# Patient Record
Sex: Female | Born: 1985 | Race: White | Hispanic: No | Marital: Single | Smoking: Current every day smoker
Health system: Southern US, Community
[De-identification: ages and names within clinical notes are randomized; demographics above are authoritative.]

## PROBLEM LIST (undated history)

## (undated) DIAGNOSIS — N12 Tubulo-interstitial nephritis, not specified as acute or chronic: Secondary | ICD-10-CM

## (undated) DIAGNOSIS — D649 Anemia, unspecified: Secondary | ICD-10-CM

## (undated) DIAGNOSIS — N132 Hydronephrosis with renal and ureteral calculous obstruction: Secondary | ICD-10-CM

## (undated) HISTORY — PX: TUBAL LIGATION: SHX77

## (undated) HISTORY — PX: WRIST SURGERY: SHX841

---
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
## 2004-01-24 ENCOUNTER — Emergency Department (HOSPITAL_COMMUNITY): Admission: EM | Admit: 2004-01-24 | Discharge: 2004-01-24 | Payer: Self-pay | Admitting: Emergency Medicine

## 2004-01-25 ENCOUNTER — Emergency Department (HOSPITAL_COMMUNITY): Admission: EM | Admit: 2004-01-25 | Discharge: 2004-01-26 | Payer: Self-pay | Admitting: Emergency Medicine

## 2004-07-05 ENCOUNTER — Inpatient Hospital Stay (HOSPITAL_COMMUNITY): Admission: AD | Admit: 2004-07-05 | Discharge: 2004-07-06 | Payer: Self-pay | Admitting: Obstetrics & Gynecology

## 2004-07-08 ENCOUNTER — Other Ambulatory Visit: Admission: RE | Admit: 2004-07-08 | Discharge: 2004-07-08 | Payer: Self-pay | Admitting: Obstetrics & Gynecology

## 2004-07-15 ENCOUNTER — Inpatient Hospital Stay (HOSPITAL_COMMUNITY): Admission: AD | Admit: 2004-07-15 | Discharge: 2004-07-15 | Payer: Self-pay | Admitting: Obstetrics and Gynecology

## 2004-09-02 ENCOUNTER — Inpatient Hospital Stay (HOSPITAL_COMMUNITY): Admission: AD | Admit: 2004-09-02 | Discharge: 2004-09-02 | Payer: Self-pay | Admitting: Obstetrics and Gynecology

## 2004-09-10 ENCOUNTER — Inpatient Hospital Stay (HOSPITAL_COMMUNITY): Admission: AD | Admit: 2004-09-10 | Discharge: 2004-09-10 | Payer: Self-pay | Admitting: Obstetrics and Gynecology

## 2005-01-01 ENCOUNTER — Inpatient Hospital Stay (HOSPITAL_COMMUNITY): Admission: AD | Admit: 2005-01-01 | Discharge: 2005-01-02 | Payer: Self-pay | Admitting: Obstetrics & Gynecology

## 2005-01-11 ENCOUNTER — Emergency Department (HOSPITAL_COMMUNITY): Admission: EM | Admit: 2005-01-11 | Discharge: 2005-01-11 | Payer: Self-pay | Admitting: Emergency Medicine

## 2005-02-06 ENCOUNTER — Inpatient Hospital Stay (HOSPITAL_COMMUNITY): Admission: AD | Admit: 2005-02-06 | Discharge: 2005-02-06 | Payer: Self-pay | Admitting: Obstetrics & Gynecology

## 2005-02-11 ENCOUNTER — Inpatient Hospital Stay (HOSPITAL_COMMUNITY): Admission: AD | Admit: 2005-02-11 | Discharge: 2005-02-11 | Payer: Self-pay | Admitting: Obstetrics and Gynecology

## 2005-02-13 ENCOUNTER — Observation Stay (HOSPITAL_COMMUNITY): Admission: AD | Admit: 2005-02-13 | Discharge: 2005-02-14 | Payer: Self-pay | Admitting: Obstetrics and Gynecology

## 2005-02-17 ENCOUNTER — Inpatient Hospital Stay (HOSPITAL_COMMUNITY): Admission: AD | Admit: 2005-02-17 | Discharge: 2005-02-20 | Payer: Self-pay | Admitting: Obstetrics and Gynecology

## 2007-03-15 ENCOUNTER — Emergency Department (HOSPITAL_COMMUNITY): Admission: EM | Admit: 2007-03-15 | Discharge: 2007-03-15 | Payer: Self-pay | Admitting: *Deleted

## 2008-05-02 ENCOUNTER — Emergency Department (HOSPITAL_COMMUNITY): Admission: EM | Admit: 2008-05-02 | Discharge: 2008-05-03 | Payer: Self-pay | Admitting: Emergency Medicine

## 2008-05-21 ENCOUNTER — Inpatient Hospital Stay (HOSPITAL_COMMUNITY): Admission: AD | Admit: 2008-05-21 | Discharge: 2008-05-21 | Payer: Self-pay | Admitting: Family Medicine

## 2008-05-23 ENCOUNTER — Inpatient Hospital Stay (HOSPITAL_COMMUNITY): Admission: AD | Admit: 2008-05-23 | Discharge: 2008-05-23 | Payer: Self-pay | Admitting: Obstetrics & Gynecology

## 2008-05-29 ENCOUNTER — Inpatient Hospital Stay (HOSPITAL_COMMUNITY): Admission: RE | Admit: 2008-05-29 | Discharge: 2008-05-29 | Payer: Self-pay | Admitting: Family Medicine

## 2008-06-29 ENCOUNTER — Inpatient Hospital Stay (HOSPITAL_COMMUNITY): Admission: AD | Admit: 2008-06-29 | Discharge: 2008-06-29 | Payer: Self-pay | Admitting: Obstetrics and Gynecology

## 2008-08-26 ENCOUNTER — Inpatient Hospital Stay (HOSPITAL_COMMUNITY): Admission: AD | Admit: 2008-08-26 | Discharge: 2008-08-26 | Payer: Self-pay | Admitting: Obstetrics and Gynecology

## 2009-01-07 ENCOUNTER — Inpatient Hospital Stay (HOSPITAL_COMMUNITY): Admission: AD | Admit: 2009-01-07 | Discharge: 2009-01-07 | Payer: Self-pay | Admitting: Obstetrics and Gynecology

## 2009-01-12 ENCOUNTER — Inpatient Hospital Stay (HOSPITAL_COMMUNITY): Admission: AD | Admit: 2009-01-12 | Discharge: 2009-01-14 | Payer: Self-pay | Admitting: Obstetrics & Gynecology

## 2009-01-13 ENCOUNTER — Encounter (INDEPENDENT_AMBULATORY_CARE_PROVIDER_SITE_OTHER): Payer: Self-pay | Admitting: Obstetrics & Gynecology

## 2010-01-04 ENCOUNTER — Emergency Department (HOSPITAL_COMMUNITY): Admission: EM | Admit: 2010-01-04 | Discharge: 2010-01-04 | Payer: Self-pay | Admitting: Emergency Medicine

## 2010-10-19 LAB — URINE CULTURE: Colony Count: 6000

## 2010-10-19 LAB — URINALYSIS, ROUTINE W REFLEX MICROSCOPIC
Bilirubin Urine: NEGATIVE
Glucose, UA: NEGATIVE mg/dL
Ketones, ur: NEGATIVE mg/dL
Protein, ur: 100 mg/dL — AB
Urobilinogen, UA: 0.2 mg/dL (ref 0.0–1.0)

## 2010-10-19 LAB — URINE MICROSCOPIC-ADD ON

## 2010-11-09 LAB — CBC
HCT: 26.4 % — ABNORMAL LOW (ref 36.0–46.0)
MCHC: 35.7 g/dL (ref 30.0–36.0)
MCV: 92.1 fL (ref 78.0–100.0)
MCV: 92.6 fL (ref 78.0–100.0)
Platelets: 159 10*3/uL (ref 150–400)
RBC: 3.22 MIL/uL — ABNORMAL LOW (ref 3.87–5.11)
RDW: 13 % (ref 11.5–15.5)
RDW: 13.2 % (ref 11.5–15.5)

## 2010-11-09 LAB — RPR: RPR Ser Ql: NONREACTIVE

## 2010-11-16 LAB — URINALYSIS, ROUTINE W REFLEX MICROSCOPIC
Bilirubin Urine: NEGATIVE
Ketones, ur: NEGATIVE mg/dL
Nitrite: NEGATIVE
Specific Gravity, Urine: 1.025 (ref 1.005–1.030)
Urobilinogen, UA: 0.2 mg/dL (ref 0.0–1.0)

## 2010-12-15 NOTE — Op Note (Signed)
[name], Dominique NO.:  [id number]   MEDICAL RECORD NO.:  [id number]          PATIENT TYPE:  INP   LOCATION:  9113                          FACILITY:  [facility]   PHYSICIAN:  Gerrit Friends. Aldona Bar, M.D.   DATE OF BIRTH:  Jan 30, 1986   DATE OF PROCEDURE:  01/13/2009  DATE OF DISCHARGE:                               OPERATIVE REPORT   PREOPERATIVE DIAGNOSIS:  Postpartum desired permanent elective  sterilization.   POSTOPERATIVE DIAGNOSIS:  Postpartum desired permanent elective  sterilization.   PATHOLOGY:  Pending.   PROCEDURE:  [place] tubal sterilization procedure.   SURGEON:  Gerrit Friends. Aldona Bar, MD   ANESTHESIA:  Epidural.   HISTORY:  This 25 year old gravida 2, now para 2, delivered on the  evening of May 13.  They expressed a desire antepartum and again  intrapartum for a permanent elective sterilization procedure.  She is  now taken to the OR according to her wishes.  She understands such  procedure is meant to be a 100% permanent, but unfortunately is not a  100% perfect as subsequent pregnancy can result.   The patient was taken to the operating room where after satisfactory  augmentation of her epidural, she was prepped and draped in the usual  fashion and good anesthetic levels were documented.  A 2-cm subumbilical  midline transverse skin incision was made - this ultimately had to be  extended for adequate visualization.  The subcu tissue and fascia were  opened.  Peritoneum identified and entered appropriately.  Fascial  incision also had to be extended for visualization.  It was possible  finally to visualize the fundus of the uterus and the right fallopian  tube was successfully identified, traced out, and fimbriated for  possible identification in the midportion of the right fallopian tube.  Knuckle was elevated in a single tie of #1 plain catgut suture tied  about the knuckle and knuckle was excised and sent to pathology with  hemostasis being adequate.   A similar procedure was carried out on left  fallopian tube.  At this time with hemostasis adequate and the segment  of each fallopian tube having been removed.  Closure of the abdomen was  carried out in layers.  The abdominal peritoneum was closed with 0  Vicryl in a running fashion.  Fascia was then closed with 0 Vicryl in  the interrupted fashion and subcu tissue and skin was then closed with a  3-0 subcuticular continuous suture.  The dressing was applied and the  patient was transported to the recovery room in satisfactory condition  having tolerated procedure well.  Estimated blood loss negligible.  All  counts correct x2.  Pathologic specimen consisted segment of each  fallopian tube.  At conclusion of procedure, the patient was resting  well in the recovery room.     Gerrit Friends. Aldona Bar, M.D.  Electronically Signed    RMW/MEDQ  D:  01/13/2009  T:  01/14/2009  Job:  [id number]

## 2010-12-18 NOTE — Discharge Summary (Signed)
NAMELOUVINIA, CUMBO NO.:  [id number]   MEDICAL RECORD NO.:  [id number]          PATIENT TYPE:  OBV   LOCATION:  9106                          FACILITY:  [facility]   PHYSICIAN:  Malva Limes, M.D.    DATE OF BIRTH:  05-11-1986   DATE OF ADMISSION:  02/13/2005  DATE OF DISCHARGE:  02/14/2005                                 DISCHARGE SUMMARY   FINAL DIAGNOSIS:  Intrauterine pregnancy at [redacted] weeks gestation, left leg  pain, probable left nephrolithiasis.   COMPLICATIONS:  None.   HISTORY:  This 25 year old G1 P0 presents at [redacted] weeks gestation complaining  of severe left flank pain. She denies any fever, chills or any hematuria.  She does have a history of kidney stones, but has never seen a urologist.  The patient's antepartum course up to this point had been uncomplicated. She  was a smoker before pregnancy, but stopped.  Otherwise, uncomplicated. She  had a negative group B strep culture obtained in the office at 36 weeks.   The patient was admitted at this time stating her pain was 10/10. She did  have some left CVA tenderness on exam.  Fetal heart tones were nicely  reactive.  A cath UA did show positive red blood cells, but was negative for  nitrite.  The patient was admitted on observation, and had urine strained.   By hospital day #2, the patient's pain had subsided. She was not requiring  any pain medicine. She was still having good fetal movement. Obstetrically,  she was doing fine. She was planned to discharge.  She was sent home on some  Percocet to take 1-2 every 4 hours as needed for pain. She was to follow up  with urologist postpartum if needed, and, of course, was to call if this  pain continued.   LABORATORIES ON DISCHARGE:  The patient had a hemoglobin of 12.0, white  blood cell count of 9.7.      Leilani Able, P.A.-C.    ______________________________  Malva Limes, M.D.    MB/MEDQ  D:  03/18/2005  T:  03/18/2005  Job:  [id number]
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
## 2011-05-03 LAB — URINALYSIS, ROUTINE W REFLEX MICROSCOPIC
Bilirubin Urine: NEGATIVE
Glucose, UA: NEGATIVE
Hgb urine dipstick: NEGATIVE
Ketones, ur: NEGATIVE
Nitrite: NEGATIVE
Protein, ur: NEGATIVE
Specific Gravity, Urine: 1.01
Urobilinogen, UA: 0.2

## 2011-05-03 LAB — CBC
HCT: 37.7
Hemoglobin: 12.7
MCV: 92.9
Platelets: 253
RDW: 12.5

## 2011-05-03 LAB — WET PREP, GENITAL
Clue Cells Wet Prep HPF POC: NONE SEEN
Trich, Wet Prep: NONE SEEN

## 2011-05-03 LAB — URINE MICROSCOPIC-ADD ON

## 2011-05-04 LAB — CBC
HCT: 34.7 — ABNORMAL LOW
Hemoglobin: 11.9 — ABNORMAL LOW
MCHC: 34.2
MCV: 93.7
Platelets: 222
RDW: 12.6

## 2011-05-04 LAB — URINE MICROSCOPIC-ADD ON

## 2011-05-04 LAB — GC/CHLAMYDIA PROBE AMP, GENITAL: Chlamydia, DNA Probe: NEGATIVE

## 2011-05-04 LAB — DIFFERENTIAL
Basophils Absolute: 0
Basophils Relative: 0
Eosinophils Absolute: 0.2
Eosinophils Relative: 2
Monocytes Absolute: 0.4

## 2011-05-04 LAB — URINALYSIS, ROUTINE W REFLEX MICROSCOPIC
Glucose, UA: NEGATIVE
Ketones, ur: 15 — AB
Protein, ur: NEGATIVE
Urobilinogen, UA: 0.2

## 2011-05-04 LAB — WET PREP, GENITAL
Clue Cells Wet Prep HPF POC: NONE SEEN
Yeast Wet Prep HPF POC: NONE SEEN

## 2012-09-11 ENCOUNTER — Emergency Department (HOSPITAL_COMMUNITY): Payer: Self-pay

## 2012-09-11 ENCOUNTER — Inpatient Hospital Stay (HOSPITAL_COMMUNITY)
Admission: EM | Admit: 2012-09-11 | Discharge: 2012-09-14 | DRG: 690 | Disposition: A | Discharge status: Home / Self Care | Payer: MEDICAID | Attending: Internal Medicine | Admitting: Internal Medicine

## 2012-09-11 ENCOUNTER — Encounter (HOSPITAL_COMMUNITY): Payer: Self-pay | Admitting: *Deleted

## 2012-09-11 DIAGNOSIS — N12 Tubulo-interstitial nephritis, not specified as acute or chronic: Principal | ICD-10-CM

## 2012-09-11 DIAGNOSIS — Z720 Tobacco use: Secondary | ICD-10-CM

## 2012-09-11 DIAGNOSIS — B954 Other streptococcus as the cause of diseases classified elsewhere: Secondary | ICD-10-CM | POA: Diagnosis present

## 2012-09-11 DIAGNOSIS — N1330 Unspecified hydronephrosis: Secondary | ICD-10-CM

## 2012-09-11 DIAGNOSIS — N201 Calculus of ureter: Secondary | ICD-10-CM

## 2012-09-11 DIAGNOSIS — E876 Hypokalemia: Secondary | ICD-10-CM

## 2012-09-11 DIAGNOSIS — F17200 Nicotine dependence, unspecified, uncomplicated: Secondary | ICD-10-CM | POA: Diagnosis present

## 2012-09-11 DIAGNOSIS — D649 Anemia, unspecified: Secondary | ICD-10-CM

## 2012-09-11 HISTORY — DX: Tubulo-interstitial nephritis, not specified as acute or chronic: N12

## 2012-09-11 HISTORY — DX: Hydronephrosis with renal and ureteral calculous obstruction: N13.2

## 2012-09-11 LAB — URINALYSIS, ROUTINE W REFLEX MICROSCOPIC
Bilirubin Urine: NEGATIVE
Ketones, ur: NEGATIVE mg/dL
Nitrite: NEGATIVE
Protein, ur: NEGATIVE mg/dL
Urobilinogen, UA: 1 mg/dL (ref 0.0–1.0)
pH: 7.5 (ref 5.0–8.0)

## 2012-09-11 LAB — CBC WITH DIFFERENTIAL/PLATELET
Basophils Absolute: 0 10*3/uL (ref 0.0–0.1)
Basophils Relative: 0 % (ref 0–1)
Eosinophils Absolute: 0 10*3/uL (ref 0.0–0.7)
Hemoglobin: 12.8 g/dL (ref 12.0–15.0)
MCH: 30.9 pg (ref 26.0–34.0)
MCHC: 33.9 g/dL (ref 30.0–36.0)
Monocytes Absolute: 0.7 10*3/uL (ref 0.1–1.0)
Monocytes Relative: 7 % (ref 3–12)
Neutrophils Relative %: 82 % — ABNORMAL HIGH (ref 43–77)
RDW: 12.6 % (ref 11.5–15.5)

## 2012-09-11 LAB — BASIC METABOLIC PANEL
BUN: 6 mg/dL (ref 6–23)
Creatinine, Ser: 0.6 mg/dL (ref 0.50–1.10)
GFR calc Af Amer: >90 mL/min (ref >90)
GFR calc non Af Amer: >90 mL/min (ref >90)

## 2012-09-11 LAB — URINE MICROSCOPIC-ADD ON

## 2012-09-11 MED ORDER — MORPHINE SULFATE 4 MG/ML IJ SOLN
4.0000 mg | Freq: Once | INTRAMUSCULAR | Status: AC
  Administered 2012-09-11: 4 mg via INTRAVENOUS
  Filled 2012-09-11: qty 1

## 2012-09-11 MED ORDER — CEFTRIAXONE SODIUM 1 G IJ SOLR
1.0000 g | Freq: Once | INTRAMUSCULAR | Status: AC
  Administered 2012-09-11: 1 g via INTRAVENOUS
  Filled 2012-09-11: qty 10

## 2012-09-11 MED ORDER — SODIUM CHLORIDE 0.9 % IV SOLN
1000.0000 mL | INTRAVENOUS | Status: DC
  Administered 2012-09-11: 1000 mL via INTRAVENOUS

## 2012-09-11 MED ORDER — IBUPROFEN 800 MG PO TABS
800.0000 mg | ORAL_TABLET | Freq: Once | ORAL | Status: AC
  Administered 2012-09-11: 800 mg via ORAL
  Filled 2012-09-11: qty 1

## 2012-09-11 MED ORDER — SODIUM CHLORIDE 0.9 % IV SOLN
1000.0000 mL | Freq: Once | INTRAVENOUS | Status: AC
  Administered 2012-09-11: 1000 mL via INTRAVENOUS

## 2012-09-11 NOTE — ED Provider Notes (Signed)
History     CSN: [id number]  Arrival date & time 09/11/12  0865   First MD Initiated Contact with Patient 09/11/12 1934      Chief Complaint  Patient presents with  . Generalized Body Aches    The history is provided by the patient.   patient presents with 2 days of nausea vomiting and fever to 102.7 today.  She denies urinary symptoms.  She's had myalgias.  She denies sore throat.  No hematemesis.  She's had ongoing intermittent right-sided abdominal pain for several months and has a history of kidney stones.  She's seen urology before in the past.  She denies sore throat.  Her symptoms are moderate to severe in severity History reviewed. No pertinent past medical history.  Past Surgical History  Procedure Laterality Date  . Tubal ligation      History reviewed. No pertinent family history.  History  Substance Use Topics  . Smoking status: Current Every Day Smoker    Types: Cigarettes  . Smokeless tobacco: Not on file  . Alcohol Use: Yes    OB History   Grav Para Term Preterm Abortions TAB SAB Ect Mult Living                  Review of Systems  All other systems reviewed and are negative.    Allergies  Review of patient's allergies indicates no known allergies.  Home Medications   Current Outpatient Rx  Name  Route  Sig  Dispense  Refill  . acetaminophen (TYLENOL) 325 MG tablet   Oral   Take 650 mg by mouth every 6 (six) hours as needed for pain.           BP 133/80  Pulse 104  Temp(Src) 100.4 F (38 C) (Oral)  Resp 20  SpO2 100%  LMP 08/30/2012  Physical Exam  Nursing note and vitals reviewed. Constitutional: She is oriented to person, place, and time. She appears well-developed and well-nourished. No distress.  HENT:  Head: Normocephalic and atraumatic.  Eyes: EOM are normal.  Neck: Normal range of motion.  Cardiovascular: Regular rhythm and normal heart sounds.   Tachycardia  Pulmonary/Chest: Effort normal and breath sounds normal.   Abdominal: Soft. She exhibits no distension. There is no tenderness.  Musculoskeletal: Normal range of motion.  Neurological: She is alert and oriented to person, place, and time.  Skin: Skin is warm and dry.  Psychiatric: She has a normal mood and affect. Judgment normal.    ED Course  Procedures (including critical care time)  Labs Reviewed  URINALYSIS, ROUTINE W REFLEX MICROSCOPIC - Abnormal; Notable for the following:    APPearance CLOUDY (*)    Hgb urine dipstick MODERATE (*)    Leukocytes, UA SMALL (*)    All other components within normal limits  URINE MICROSCOPIC-ADD ON - Abnormal; Notable for the following:    Squamous Epithelial / LPF MANY (*)    Bacteria, UA MANY (*)    All other components within normal limits  CBC WITH DIFFERENTIAL - Abnormal; Notable for the following:    Neutrophils Relative 82 (*)    Neutro Abs 8.4 (*)    Lymphocytes Relative 11 (*)    All other components within normal limits  URINE CULTURE  PREGNANCY, URINE  BASIC METABOLIC PANEL   Ct Abdomen Pelvis Wo Contrast  09/11/2012  *RADIOLOGY REPORT*  Clinical Data: Right flank pain radiating to the back.  History renal calculi.  Body aches and fever.  CT  ABDOMEN AND PELVIS WITHOUT CONTRAST  Technique:  Multidetector CT imaging of the abdomen and pelvis was performed following the standard protocol without intravenous contrast.  Comparison: 04/22/2006  Findings: Mild dependent and right infrahilar subsegmental atelectasis noted.  The visualized portion of the liver, spleen, pancreas, and adrenal glands appear unremarkable in noncontrast CT appearance.  The gallbladder and biliary system appear unremarkable.  No pathologic retroperitoneal or porta hepatis adenopathy is identified.  Right hydronephrosis noted with hydroureter extending down to a 10 mm stone is proximal to the iliac vessel crossover.  The right ureter distal to this point is of normal caliber.  There is a 4 mm nonobstructive right kidney lower  pole calculus and a separate 2 mm nonobstructive right kidney lower pole calculus.  No left-sided calculi observed.  Urinary bladder not distended but unremarkable.  Appendix normal. No pathologic pelvic adenopathy is identified.  The uterus and adnexa appear unremarkable.  No free pelvic fluid.  IMPRESSION:  1.  Right hydronephrosis and hydroureter extending down to a 10 x 12 x 8 mm right mid ureteral calculus, just proximal to the iliac vessel crossover. 2.  Nonobstructive right kidney lower pole nephrolithiasis. 3.  Subsegmental atelectasis in the lung bases.   Original Report Authenticated By: Gaylyn Rong, M.D.    I personally reviewed the imaging tests through PACS system I reviewed available ER/hospitalization records through the EMR   1. Pyelonephritis   2. Right ureteral stone       MDM  11:44 PM Spoke with Dr Annabell Howells of urology who recommends admission to the hospitalist and IR consultation in AM for right nephrostomy tube. Urology to consult in the AM          Lyanne Co, MD 09/11/12 [id number]

## 2012-09-11 NOTE — ED Notes (Signed)
Rt flank  Pain radiating to back. Admits to h/o kidney stones.

## 2012-09-11 NOTE — ED Notes (Addendum)
Pt c/o body aches, fever, and right abd pain reports symptoms x's 1 week. Pt took tylenol "way earlier today" for fever.

## 2012-09-12 ENCOUNTER — Inpatient Hospital Stay (HOSPITAL_COMMUNITY): Payer: Self-pay | Admitting: Anesthesiology

## 2012-09-12 ENCOUNTER — Encounter (HOSPITAL_COMMUNITY)
Admission: EM | Disposition: A | Discharge status: Home / Self Care | Payer: Self-pay | Source: Home / Self Care | Attending: Internal Medicine

## 2012-09-12 ENCOUNTER — Encounter (HOSPITAL_COMMUNITY): Payer: Self-pay | Admitting: Anesthesiology

## 2012-09-12 ENCOUNTER — Encounter (HOSPITAL_COMMUNITY): Payer: Self-pay | Admitting: Internal Medicine

## 2012-09-12 DIAGNOSIS — N12 Tubulo-interstitial nephritis, not specified as acute or chronic: Principal | ICD-10-CM

## 2012-09-12 DIAGNOSIS — F17200 Nicotine dependence, unspecified, uncomplicated: Secondary | ICD-10-CM

## 2012-09-12 DIAGNOSIS — Z720 Tobacco use: Secondary | ICD-10-CM | POA: Diagnosis present

## 2012-09-12 DIAGNOSIS — N201 Calculus of ureter: Secondary | ICD-10-CM

## 2012-09-12 DIAGNOSIS — N1330 Unspecified hydronephrosis: Secondary | ICD-10-CM

## 2012-09-12 HISTORY — PX: CYSTOSCOPY WITH STENT PLACEMENT: SHX5790

## 2012-09-12 LAB — CBC
HCT: 32.1 % — ABNORMAL LOW (ref 36.0–46.0)
Hemoglobin: 10.9 g/dL — ABNORMAL LOW (ref 12.0–15.0)
MCH: 31.5 pg (ref 26.0–34.0)
MCHC: 34 g/dL (ref 30.0–36.0)
RBC: 3.46 MIL/uL — ABNORMAL LOW (ref 3.87–5.11)

## 2012-09-12 LAB — BASIC METABOLIC PANEL
BUN: 6 mg/dL (ref 6–23)
Chloride: 108 mEq/L (ref 96–112)
GFR calc Af Amer: >90 mL/min (ref >90)
GFR calc non Af Amer: >90 mL/min (ref >90)
Glucose, Bld: 89 mg/dL (ref 70–99)
Potassium: 3.3 mEq/L — ABNORMAL LOW (ref 3.5–5.1)
Sodium: 139 mEq/L (ref 135–145)

## 2012-09-12 LAB — GLUCOSE, CAPILLARY
Glucose-Capillary: 85 mg/dL (ref 70–99)
Glucose-Capillary: 70 mg/dL (ref 70–99)
Glucose-Capillary: 65 mg/dL — ABNORMAL LOW (ref 70–99)
Glucose-Capillary: 67 mg/dL — ABNORMAL LOW (ref 70–99)
Glucose-Capillary: 151 mg/dL — ABNORMAL HIGH (ref 70–99)

## 2012-09-12 SURGERY — CYSTOSCOPY, WITH STENT INSERTION
Anesthesia: General | Laterality: Right | Wound class: Clean Contaminated

## 2012-09-12 MED ORDER — FENTANYL CITRATE 0.05 MG/ML IJ SOLN
INTRAMUSCULAR | Status: DC | PRN
  Administered 2012-09-12: 25 ug via INTRAVENOUS
  Administered 2012-09-12: 50 ug via INTRAVENOUS

## 2012-09-12 MED ORDER — SODIUM CHLORIDE 0.9 % IJ SOLN
3.0000 mL | Freq: Two times a day (BID) | INTRAMUSCULAR | Status: DC
  Administered 2012-09-12: 3 mL via INTRAVENOUS

## 2012-09-12 MED ORDER — LACTATED RINGERS IV SOLN
INTRAVENOUS | Status: DC | PRN
  Administered 2012-09-12: 15:00:00 via INTRAVENOUS

## 2012-09-12 MED ORDER — IOHEXOL 300 MG/ML  SOLN
INTRAMUSCULAR | Status: DC | PRN
  Administered 2012-09-12: 25 mL via INTRAVENOUS

## 2012-09-12 MED ORDER — INFLUENZA VIRUS VACC SPLIT PF IM SUSP
0.5000 mL | Freq: Once | INTRAMUSCULAR | Status: AC
  Administered 2012-09-12: 0.5 mL via INTRAMUSCULAR
  Filled 2012-09-12 (×2): qty 0.5

## 2012-09-12 MED ORDER — PIPERACILLIN-TAZOBACTAM 3.375 G IVPB
3.3750 g | Freq: Three times a day (TID) | INTRAVENOUS | Status: DC
  Administered 2012-09-12 – 2012-09-13 (×6): 3.375 g via INTRAVENOUS
  Filled 2012-09-12 (×7): qty 50

## 2012-09-12 MED ORDER — LACTATED RINGERS IV SOLN: INTRAVENOUS | Status: DC

## 2012-09-12 MED ORDER — ONDANSETRON HCL 4 MG/2ML IJ SOLN
4.0000 mg | Freq: Four times a day (QID) | INTRAMUSCULAR | Status: DC | PRN
  Administered 2012-09-12: 4 mg via INTRAVENOUS
  Filled 2012-09-12: qty 2

## 2012-09-12 MED ORDER — ACETAMINOPHEN 650 MG RE SUPP: 650.0000 mg | Freq: Four times a day (QID) | RECTAL | Status: DC | PRN

## 2012-09-12 MED ORDER — MIDAZOLAM HCL 5 MG/5ML IJ SOLN
INTRAMUSCULAR | Status: DC | PRN
  Administered 2012-09-12: 2 mg via INTRAVENOUS

## 2012-09-12 MED ORDER — SODIUM CHLORIDE 0.9 % IR SOLN
Status: DC | PRN
  Administered 2012-09-12: 3000 mL

## 2012-09-12 MED ORDER — ONDANSETRON HCL 4 MG PO TABS
4.0000 mg | ORAL_TABLET | Freq: Four times a day (QID) | ORAL | Status: DC | PRN
  Administered 2012-09-13: 4 mg via ORAL
  Filled 2012-09-12: qty 1

## 2012-09-12 MED ORDER — PROMETHAZINE HCL 25 MG/ML IJ SOLN: 6.2500 mg | INTRAMUSCULAR | Status: DC | PRN

## 2012-09-12 MED ORDER — PNEUMOCOCCAL VAC POLYVALENT 25 MCG/0.5ML IJ INJ
0.5000 mL | INJECTION | Freq: Once | INTRAMUSCULAR | Status: AC
  Administered 2012-09-12: 0.5 mL via INTRAMUSCULAR
  Filled 2012-09-12 (×2): qty 0.5

## 2012-09-12 MED ORDER — HYDROMORPHONE HCL PF 1 MG/ML IJ SOLN
0.5000 mg | INTRAMUSCULAR | Status: DC | PRN
  Administered 2012-09-12 – 2012-09-14 (×17): 0.5 mg via INTRAVENOUS
  Filled 2012-09-12 (×17): qty 1

## 2012-09-12 MED ORDER — SODIUM CHLORIDE 0.9 % IV SOLN
INTRAVENOUS | Status: DC
  Administered 2012-09-12 – 2012-09-14 (×5): via INTRAVENOUS

## 2012-09-12 MED ORDER — FENTANYL CITRATE 0.05 MG/ML IJ SOLN
25.0000 ug | INTRAMUSCULAR | Status: DC | PRN
  Administered 2012-09-12 (×2): 50 ug via INTRAVENOUS

## 2012-09-12 MED ORDER — PROPOFOL 10 MG/ML IV BOLUS
INTRAVENOUS | Status: DC | PRN
  Administered 2012-09-12: 150 mg via INTRAVENOUS

## 2012-09-12 MED ORDER — ONDANSETRON HCL 4 MG/2ML IJ SOLN
INTRAMUSCULAR | Status: DC | PRN
  Administered 2012-09-12: 4 mg via INTRAVENOUS

## 2012-09-12 MED ORDER — LIDOCAINE HCL 2 % EX GEL
CUTANEOUS | Status: DC | PRN
  Administered 2012-09-12: 1 via URETHRAL

## 2012-09-12 MED ORDER — ACETAMINOPHEN 325 MG PO TABS
650.0000 mg | ORAL_TABLET | Freq: Four times a day (QID) | ORAL | Status: DC | PRN
  Administered 2012-09-13: 650 mg via ORAL
  Filled 2012-09-12: qty 2

## 2012-09-12 SURGICAL SUPPLY — 21 items
ADAPTER CATH URET PLST 4-6FR (CATHETERS) ×2 IMPLANT
ADPR CATH URET STRL DISP 4-6FR (CATHETERS) ×1
BAG URO CATCHER STRL LF (DRAPE) ×2 IMPLANT
BASKET ZERO TIP NITINOL 2.4FR (BASKET) IMPLANT
BSKT STON RTRVL ZERO TP 2.4FR (BASKET)
CATH INTERMIT  6FR 70CM (CATHETERS) ×2 IMPLANT
CATH URET 5FR 28IN CONE TIP (BALLOONS)
CATH URET 5FR 70CM CONE TIP (BALLOONS) IMPLANT
CLOTH BEACON ORANGE TIMEOUT ST (SAFETY) ×2 IMPLANT
DRAPE CAMERA CLOSED 9X96 (DRAPES) ×2 IMPLANT
GLOVE BIOGEL M STRL SZ7.5 (GLOVE) ×2 IMPLANT
GOWN PREVENTION PLUS XLARGE (GOWN DISPOSABLE) ×2 IMPLANT
GOWN STRL NON-REIN LRG LVL3 (GOWN DISPOSABLE) ×2 IMPLANT
GOWN STRL REIN XL XLG (GOWN DISPOSABLE) ×2 IMPLANT
GUIDEWIRE ANG ZIPWIRE 038X150 (WIRE) ×2 IMPLANT
GUIDEWIRE STR DUAL SENSOR (WIRE) ×2 IMPLANT
MANIFOLD NEPTUNE II (INSTRUMENTS) ×2 IMPLANT
PACK CYSTO (CUSTOM PROCEDURE TRAY) ×2 IMPLANT
STENT CONTOUR 6FRX24X.038 (STENTS) ×2 IMPLANT
TUBING CONNECTING 10 (TUBING) IMPLANT
WIRE COONS/BENSON .038X145CM (WIRE) ×2 IMPLANT

## 2012-09-12 NOTE — Progress Notes (Signed)
Patient ID: Dominique Perry, female   DOB: May 08, 1986, 27 y.o.   MRN: [id number]   I have reviewed the CT and will be up to see the patient later today.  She is afebrile with a normal white count and her UA looks contaminated more than infected.   She will probably need a right ESWL for management of her stone and doesn't appear that she will need a perc tube.   Depending on her pain control, a stent may be worthwhile pending the ESWL.     I will see the patient later today to complete the consultation.

## 2012-09-12 NOTE — Progress Notes (Signed)
ANTIBIOTIC CONSULT NOTE - INITIAL  Pharmacy Consult for zosyn Indication: UTI  No Known Allergies  Patient Measurements: Height: 5\' 6"  (167.6 cm) Weight: 164 lb 11.2 oz (74.707 kg) IBW/kg (Calculated) : 59.3 Adjusted Body Weight:   Vital Signs: Temp: 97.9 F (36.6 C) (02/11 0131) Temp src: Oral (02/11 0131) BP: 109/69 mmHg (02/11 0131) Pulse Rate: 73 (02/11 0131) Intake/Output from previous day:   Intake/Output from this shift:    Labs:  Recent Labs  09/11/12 2230  WBC 10.3  HGB 12.8  PLT 217  CREATININE 0.60   Estimated Creatinine Clearance: 109.2 ml/min (by C-G formula based on Cr of 0.6). No results found for this basename: VANCOTROUGH, VANCOPEAK, VANCORANDOM, GENTTROUGH, GENTPEAK, GENTRANDOM, TOBRATROUGH, TOBRAPEAK, TOBRARND, AMIKACINPEAK, AMIKACINTROU, AMIKACIN,  in the last 72 hours   Microbiology: No results found for this or any previous visit (from the past 720 hour(s)).  Medical History: History reviewed. No pertinent past medical history.  Medications:  Anti-infectives   Start     Dose/Rate Route Frequency Ordered Stop   09/12/12 0145  piperacillin-tazobactam (ZOSYN) IVPB 3.375 g     3.375 g 12.5 mL/hr over 240 Minutes Intravenous 3 times per day 09/12/12 0140     09/11/12 2130  cefTRIAXone (ROCEPHIN) 1 g in dextrose 5 % 50 mL IVPB     1 g 100 mL/hr over 30 Minutes Intravenous  Once 09/11/12 2122 09/11/12 2204     Assessment: Patient with UTI.  Zosyn per pharmacy ordered.  Goal of Therapy:  Zosyn based on renal function   Plan:  Follow up culture results Zosyn 3.375g IV Q8H infused over 4hrs.   Darlina Guys, Jacquenette Shone Crowford 09/12/2012,1:46 AM

## 2012-09-12 NOTE — Consult Note (Signed)
   Subjective: Dominique Perry is a 26 yo WF with a history of stones who has been having intermittant pain over the last few months.  She had the onset 2 days ago of more severe right flank pain with nausea and vomiting and a fever to 103.  She has had no voiding symptoms or hematuria.   She has no other GU history or associated signs or symptoms.  I was asked to see her in consultation by Dr. Vann.  ROS: Negative except as above on a full 12 point review.   Past Medical History  Diagnosis Date  . Ureteral stone with hydronephrosis   . Pyelonephritis    Past Surgical History  Procedure Laterality Date  . Tubal ligation    . Wrist surgery     History   Social History  . Marital Status: Single    Spouse Name: N/A    Number of Children: N/A  . Years of Education: N/A   Occupational History  . Not on file.   Social History Main Topics  . Smoking status: Current Every Day Smoker    Types: Cigarettes  . Smokeless tobacco: Not on file  . Alcohol Use: Yes  . Drug Use: Yes    Special: Marijuana  . Sexually Active: Not on file   Other Topics Concern  . Not on file   Social History Narrative  . No narrative on file   Family History  Problem Relation Age of Onset  . Hypertension Father   . Lung cancer Father   No Known Allergies   Medication List    ASK your doctor about these medications       acetaminophen 325 MG tablet  Commonly known as:  TYLENOL  Take 650 mg by mouth every 6 (six) hours as needed for pain.       Objective: Vital signs in last 24 hours: Temp:  [97.9 F (36.6 C)-102.7 F (39.3 C)] 98.1 F (36.7 C) (02/11 0452) Pulse Rate:  [70-104] 70 (02/11 0452) Resp:  [18-20] 18 (02/11 0452) BP: (96-133)/(56-80) 108/56 mmHg (02/11 0452) SpO2:  [95 %-100 %] 95 % (02/11 0452) Weight:  [74.707 kg (164 lb 11.2 oz)] 74.707 kg (164 lb 11.2 oz) (02/11 0131)  Intake/Output from previous day: 02/10 0701 - 02/11 0700 In: -  Out: 350 [Urine:350] Intake/Output this  shift:    General appearance: alert and no distress Head: Normocephalic, without obvious abnormality, atraumatic Neck: no adenopathy, no carotid bruit, no JVD and supple, symmetrical, trachea midline Resp: clear to auscultation bilaterally Cardio: regular rate and rhythm GI: soft with no mass or HSM.  She has RUQ and RCVAT.  +BS. no hernias.  Extremities: extremities normal, atraumatic, no cyanosis or edema Skin: Skin color, texture, turgor normal. No rashes or lesions Lymph nodes: Cervical, supraclavicular, and axillary nodes normal. and no inguinal adenopathy.  Neurologic: Grossly normal  Lab Results:   Recent Labs  09/11/12 2230 09/12/12 0456  WBC 10.3 7.0  HGB 12.8 10.9*  HCT 37.8 32.1*  PLT 217 163   BMET  Recent Labs  09/11/12 2230 09/12/12 0456  NA 134* 139  K 3.2* 3.3*  CL 99 108  CO2 23 23  GLUCOSE 78 89  BUN 6 6  CREATININE 0.60 0.60  CALCIUM 8.8 7.9*   PT/INR No results found for this basename: LABPROT, INR,  in the last 72 hours ABG No results found for this basename: PHART, PCO2, PO2, HCO3,  in the last 72 hours    Studies/Results: Ct Abdomen Pelvis Wo Contrast  09/11/2012  *RADIOLOGY REPORT*  Clinical Data: Right flank pain radiating to the back.  History renal calculi.  Body aches and fever.  CT ABDOMEN AND PELVIS WITHOUT CONTRAST  Technique:  Multidetector CT imaging of the abdomen and pelvis was performed following the standard protocol without intravenous contrast.  Comparison: 04/22/2006  Findings: Mild dependent and right infrahilar subsegmental atelectasis noted.  The visualized portion of the liver, spleen, pancreas, and adrenal glands appear unremarkable in noncontrast CT appearance.  The gallbladder and biliary system appear unremarkable.  No pathologic retroperitoneal or porta hepatis adenopathy is identified.  Right hydronephrosis noted with hydroureter extending down to a 10 mm stone is proximal to the iliac vessel crossover.  The right ureter  distal to this point is of normal caliber.  There is a 4 mm nonobstructive right kidney lower pole calculus and a separate 2 mm nonobstructive right kidney lower pole calculus.  No left-sided calculi observed.  Urinary bladder not distended but unremarkable.  Appendix normal. No pathologic pelvic adenopathy is identified.  The uterus and adnexa appear unremarkable.  No free pelvic fluid.  IMPRESSION:  1.  Right hydronephrosis and hydroureter extending down to a 10 x 12 x 8 mm right mid ureteral calculus, just proximal to the iliac vessel crossover. 2.  Nonobstructive right kidney lower pole nephrolithiasis. 3.  Subsegmental atelectasis in the lung bases.   Original Report Authenticated By: Walter Liebkemann, M.D.     Anti-infectives: Anti-infectives   Start     Dose/Rate Route Frequency Ordered Stop   09/12/12 0145  piperacillin-tazobactam (ZOSYN) IVPB 3.375 g     3.375 g 12.5 mL/hr over 240 Minutes Intravenous 3 times per day 09/12/12 0140     09/11/12 2130  cefTRIAXone (ROCEPHIN) 1 g in dextrose 5 % 50 mL IVPB     1 g 100 mL/hr over 30 Minutes Intravenous  Once 09/11/12 2122 09/11/12 2204      Current Facility-Administered Medications  Medication Dose Route Frequency Provider Last Rate Last Dose  . 0.9 %  sodium chloride infusion   Intravenous Continuous Arshad N Kakrakandy, MD 125 mL/hr at 09/12/12 0145    . acetaminophen (TYLENOL) tablet 650 mg  650 mg Oral Q6H PRN Arshad N Kakrakandy, MD       Or  . acetaminophen (TYLENOL) suppository 650 mg  650 mg Rectal Q6H PRN Arshad N Kakrakandy, MD      . HYDROmorphone (DILAUDID) injection 0.5 mg  0.5 mg Intravenous Q2H PRN Arshad N Kakrakandy, MD   0.5 mg at 09/12/12 0527  . influenza  inactive virus vaccine (FLUZONE/FLUARIX) injection 0.5 mL  0.5 mL Intramuscular Once Kessa L Sauve, RN      . ondansetron (ZOFRAN) tablet 4 mg  4 mg Oral Q6H PRN Arshad N Kakrakandy, MD       Or  . ondansetron (ZOFRAN) injection 4 mg  4 mg Intravenous Q6H PRN  Arshad N Kakrakandy, MD      . piperacillin-tazobactam (ZOSYN) IVPB 3.375 g  3.375 g Intravenous Q8H Arshad N Kakrakandy, MD   3.375 g at 09/12/12 0618  . pneumococcal 23 valent vaccine (PNU-IMMUNE) injection 0.5 mL  0.5 mL Intramuscular Once Latisa L Sauve, RN      . sodium chloride 0.9 % injection 3 mL  3 mL Intravenous Q12H Arshad N Kakrakandy, MD   3 mL at 09/12/12 0145    Assessment: She has a right mid ureteral stone with pain and fever although she   is now afebrile and doesn't have an elevated WBC count.  Plan: She need cystoscopy with placement of a right ureteral stent with probable ESWL vs ureteroscopy at a later date. I have reviewed the risks of bleeding, infection, ureteral injury, stent irritation, poss. Need for a perc tube, thrombotic events and anesthetic complications.   I will get her set up for later today.   CC: Dr. Vann    LOS: 1 day    Nyquan Selbe J 09/12/2012  

## 2012-09-12 NOTE — Transfer of Care (Signed)
Immediate Anesthesia Transfer of Care Note  Patient: Dominique Perry  Procedure(s) Performed: Procedure(s): CYSTOSCOPY WITH STENT PLACEMENT (Right)  Patient Location: PACU  Anesthesia Type:General  Level of Consciousness: awake, alert , sedated and patient cooperative  Airway & Oxygen Therapy: Patient Spontanous Breathing and Patient connected to face mask oxygen  Post-op Assessment: Report given to PACU RN and Post -op Vital signs reviewed and stable  Post vital signs: Reviewed and stable  Complications: No apparent anesthesia complications

## 2012-09-12 NOTE — Op Note (Signed)
Preoperative diagnosis:  1. Right distal ureteral calculus with concurrent febrile urinary tract infection   Postoperative diagnosis:  1. Same   Procedure:  1. Cystoscopy 2. Right ureteral stent placement (6 [place]) [place] 3. Right retrograde pyelography with interpretation   Surgeon: Valetta Fuller, MD  Anesthesia: General  Complications: None  Intraoperative findings: Large right distal stone causing very high-grade obstruction  EBL: Minimal  Specimens: None  Indication: Dominique Perry is a 27 y.o. patient with a 12 mm right distal ureteral stone with hydronephrosis and concurrent febrile urinary tract infection. She was seen in consultation by Dr. Bjorn Pippin. Clinically she has not been floridly septic. She is receiving supportive care and broad-spectrum antibiotic coverage. It was felt that she would benefit from double-J stent placement to decompress the kidney with definitive management of the stone at a later date.. After reviewing the management options for treatment, she elected to proceed with the above surgical procedure(s). We have discussed the potential benefits and risks of the procedure, side effects of the proposed treatment, the likelihood of the patient achieving the goals of the procedure, and any potential problems that might occur during the procedure or recuperation. Informed consent has been obtained.  Description of procedure:  The patient was taken to the operating room and general anesthesia was induced.  The patient was placed in the dorsal lithotomy position, prepped and draped in the usual sterile fashion, and preoperative antibiotics were administered. A preoperative time-out was performed.   Cystourethroscopy was performed. The bladder was then systematically examined in its entirety. There was no evidence for any bladder tumors, stones, or other mucosal pathology.    Attention then turned to the right ureteral orifice and a ureteral catheter was  used to intubate the ureteral orifice.  Omnipaque contrast was injected through the ureteral catheter and a retrograde pyelogram was performed with findings as dictated above.  A 0.38 sensor guidewire was then advanced up the right ureter into the renal pelvis under fluoroscopic guidance.  The wire was then backloaded through the cystoscope and a ureteral stent was advance over the wire using Seldinger technique.  The stent was positioned appropriately under fluoroscopic and cystoscopic guidance.  The wire was then removed with an adequate stent curl noted in the renal pelvis as well as in the bladder.  The bladder was then emptied and the procedure ended.  The patient appeared to tolerate the procedure well and without complications.  The patient was able to be awakened and transferred to the recovery unit in satisfactory condition.    Valetta Fuller, MD

## 2012-09-12 NOTE — Progress Notes (Signed)
   CARE MANAGEMENT NOTE 09/12/2012  Patient:  Dominique Perry, Dominique Perry   Account Number:  [id number]  Date Initiated:  09/12/2012  Documentation initiated by:  Jiles Crocker  Subjective/Objective Assessment:   ADMITTED WITH Pyelonephritis with right-sided hydronephrosis and midureter calculus     Action/Plan:   INDEPENDENT PRIOR TO ADMISSION  CONTINUES TO WORK FULL TIME   Anticipated DC Date:  09/19/2012   Anticipated DC Plan:  HOME/SELF CARE  In-house referral  Financial Counselor      DC Planning Services  CM consult        Status of service:  In process, will continue to follow Medicare Important Message given?  NA - LOS <3 / Initial given by admissions (If response is "NO", the following Medicare IM given date fields will be blank) Per UR Regulation:  Reviewed for med. necessity/level of care/duration of stay  Comments:  09/12/2012- B Adeyemi Hamad RN,BSN,MHA

## 2012-09-12 NOTE — H&P (Signed)
Dominique Perry is an 27 y.o. female.  Patient was seen and examined on September 12, 2012. PCP - none.  Chief Complaint: Fever chills and right flank pain. HPI: 26 year-old female with history of nephrolithiasis presented the ER because of fever and chills. Patient has also been having nausea vomiting with right flank pain over the last 2 days. Patient states that she does have chronic right flank pain which had worsened last 2 days. Denies any diarrhea shortness of breath or productive cough. In the ER CT abdomen pelvis show right-sided nephrosis with mid ureter calculus. On-call urologist Dr. Wilson Singer was contacted and at this time patient will be admitted for further management.  History reviewed. No pertinent past medical history.  Past Surgical History  Procedure Laterality Date  . Tubal ligation      Family History  Problem Relation Age of Onset  . Hypertension Father   . Lung cancer Father    Social History:  reports that she has been smoking Cigarettes.  She has been smoking about 0.00 packs per day. She does not have any smokeless tobacco history on file. She reports that  drinks alcohol. She reports that she uses illicit drugs (Marijuana).  Allergies: No Known Allergies   (Not in a hospital admission)  Results for orders placed during the hospital encounter of 09/11/12 (from the past 48 hour(s))  URINALYSIS, ROUTINE W REFLEX MICROSCOPIC     Status: Abnormal   Collection Time    09/11/12  8:37 PM      Result Value Range   Color, Urine YELLOW  YELLOW   APPearance CLOUDY (*) CLEAR   Specific Gravity, Urine 1.015  1.005 - 1.030   pH 7.5  5.0 - 8.0   Glucose, UA NEGATIVE  NEGATIVE mg/dL   Hgb urine dipstick MODERATE (*) NEGATIVE   Bilirubin Urine NEGATIVE  NEGATIVE   Ketones, ur NEGATIVE  NEGATIVE mg/dL   Protein, ur NEGATIVE  NEGATIVE mg/dL   Urobilinogen, UA 1.0  0.0 - 1.0 mg/dL   Nitrite NEGATIVE  NEGATIVE   Leukocytes, UA SMALL (*) NEGATIVE  PREGNANCY, URINE     Status:  None   Collection Time    09/11/12  8:37 PM      Result Value Range   Preg Test, Ur NEGATIVE  NEGATIVE   Comment:            THE SENSITIVITY OF THIS     METHODOLOGY IS >20 mIU/mL.  URINE MICROSCOPIC-ADD ON     Status: Abnormal   Collection Time    09/11/12  8:37 PM      Result Value Range   Squamous Epithelial / LPF MANY (*) RARE   WBC, UA 21-50  <3 WBC/hpf   RBC / HPF 11-20  <3 RBC/hpf   Bacteria, UA MANY (*) RARE  CBC WITH DIFFERENTIAL     Status: Abnormal   Collection Time    09/11/12 10:30 PM      Result Value Range   WBC 10.3  4.0 - 10.5 K/uL   RBC 4.14  3.87 - 5.11 MIL/uL   Hemoglobin 12.8  12.0 - 15.0 g/dL   HCT 47.8  29.5 - 62.1 %   MCV 91.3  78.0 - 100.0 fL   MCH 30.9  26.0 - 34.0 pg   MCHC 33.9  30.0 - 36.0 g/dL   RDW 30.8  65.7 - 84.6 %   Platelets 217  150 - 400 K/uL   Neutrophils Relative 82 (*) 43 -  77 %   Neutro Abs 8.4 (*) 1.7 - 7.7 K/uL   Lymphocytes Relative 11 (*) 12 - 46 %   Lymphs Abs 1.1  0.7 - 4.0 K/uL   Monocytes Relative 7  3 - 12 %   Monocytes Absolute 0.7  0.1 - 1.0 K/uL   Eosinophils Relative 0  0 - 5 %   Eosinophils Absolute 0.0  0.0 - 0.7 K/uL   Basophils Relative 0  0 - 1 %   Basophils Absolute 0.0  0.0 - 0.1 K/uL  BASIC METABOLIC PANEL     Status: Abnormal   Collection Time    09/11/12 10:30 PM      Result Value Range   Sodium 134 (*) 135 - 145 mEq/L   Potassium 3.2 (*) 3.5 - 5.1 mEq/L   Chloride 99  96 - 112 mEq/L   CO2 23  19 - 32 mEq/L   Glucose, Bld 78  70 - 99 mg/dL   BUN 6  6 - 23 mg/dL   Creatinine, Ser 1.61  0.50 - 1.10 mg/dL   Calcium 8.8  8.4 - 09.6 mg/dL   GFR calc non Af Amer >90  >90 mL/min   GFR calc Af Amer >90  >90 mL/min   Comment:            The eGFR has been calculated     using the CKD EPI equation.     This calculation has not been     validated in all clinical     situations.     eGFR's persistently     <90 mL/min signify     possible Chronic Kidney Disease.   Ct Abdomen Pelvis Wo  Contrast  09/11/2012  *RADIOLOGY REPORT*  Clinical Data: Right flank pain radiating to the back.  History renal calculi.  Body aches and fever.  CT ABDOMEN AND PELVIS WITHOUT CONTRAST  Technique:  Multidetector CT imaging of the abdomen and pelvis was performed following the standard protocol without intravenous contrast.  Comparison: 04/22/2006  Findings: Mild dependent and right infrahilar subsegmental atelectasis noted.  The visualized portion of the liver, spleen, pancreas, and adrenal glands appear unremarkable in noncontrast CT appearance.  The gallbladder and biliary system appear unremarkable.  No pathologic retroperitoneal or porta hepatis adenopathy is identified.  Right hydronephrosis noted with hydroureter extending down to a 10 mm stone is proximal to the iliac vessel crossover.  The right ureter distal to this point is of normal caliber.  There is a 4 mm nonobstructive right kidney lower pole calculus and a separate 2 mm nonobstructive right kidney lower pole calculus.  No left-sided calculi observed.  Urinary bladder not distended but unremarkable.  Appendix normal. No pathologic pelvic adenopathy is identified.  The uterus and adnexa appear unremarkable.  No free pelvic fluid.  IMPRESSION:  1.  Right hydronephrosis and hydroureter extending down to a 10 x 12 x 8 mm right mid ureteral calculus, just proximal to the iliac vessel crossover. 2.  Nonobstructive right kidney lower pole nephrolithiasis. 3.  Subsegmental atelectasis in the lung bases.   Original Report Authenticated By: Gaylyn Rong, M.D.     Review of Systems  Constitutional: Positive for fever and chills.  HENT: Negative.   Eyes: Negative.   Respiratory: Negative.   Cardiovascular: Negative.   Gastrointestinal: Positive for nausea and vomiting.  Genitourinary: Positive for flank pain.  Musculoskeletal: Negative.   Skin: Negative.   Neurological: Negative.   Endo/Heme/Allergies: Negative.   Psychiatric/Behavioral:  Negative.  Blood pressure 110/68, pulse 79, temperature 98 F (36.7 C), temperature source Oral, resp. rate 18, last menstrual period 08/30/2012, SpO2 100.00%. Physical Exam  Constitutional: She is oriented to person, place, and time. She appears well-developed and well-nourished. No distress.  HENT:  Head: Normocephalic and atraumatic.  Right Ear: External ear normal.  Left Ear: External ear normal.  Nose: Nose normal.  Mouth/Throat: Oropharynx is clear and moist. No oropharyngeal exudate.  Eyes: Conjunctivae are normal. Pupils are equal, round, and reactive to light. Right eye exhibits no discharge. Left eye exhibits no discharge. No scleral icterus.  Neck: Normal range of motion. Neck supple.  Cardiovascular: Normal rate and regular rhythm.   Respiratory: Effort normal and breath sounds normal. No respiratory distress. She has no wheezes. She has no rales.  GI: Soft. Bowel sounds are normal. She exhibits no distension. There is tenderness. There is no rebound.  Musculoskeletal: She exhibits no edema and no tenderness.  Neurological: She is alert and oriented to person, place, and time.  Skin: Skin is warm and dry. She is not diaphoretic.     Assessment/Plan #1. Pyelonephritis with right-sided hydronephrosis and midureter calculus - patient has been started on empiric antibiotics for complicated urinary tract infection. Patient at this time does not look septic or toxic. Closely observe in telemetry. I have discussed the urologist Dr. Wilson Singer who will be seeing patient in consult. Patient will be kept n.p.o. in anticipation of possible surgical procedures stent versus nephrostomy tube. #2. Tobacco abuse - advised to quit smoking.  CODE STATUS - full code.  Kenyetta Fife N. 09/12/2012, 12:30 AM

## 2012-09-12 NOTE — Anesthesia Postprocedure Evaluation (Signed)
Anesthesia Post Note  Patient: Dominique Perry  Procedure(s) Performed: Procedure(s) (LRB): CYSTOSCOPY WITH STENT PLACEMENT (Right)  Anesthesia type: General  Patient location: PACU  Post pain: Pain level controlled  Post assessment: Post-op Vital signs reviewed  Last Vitals:  Filed Vitals:   09/12/12 1646  BP: 117/76  Pulse: 68  Temp:   Resp: 14    Post vital signs: Reviewed  Level of consciousness: sedated  Complications: No apparent anesthesia complications

## 2012-09-12 NOTE — Progress Notes (Signed)
Dr. Rica Mast made aware of patient's CBG results in PACU- 65

## 2012-09-12 NOTE — Progress Notes (Signed)
Patient admitted early this AM by Dr. Kirtland Bouchard.  Dr. Annabell Howells with urology to see and possible do ESWL for management of her stone and may need stent placed as well.  Labs in AM NPO for now until procedure decided.  Dominique Perry

## 2012-09-12 NOTE — Anesthesia Preprocedure Evaluation (Signed)
Anesthesia Evaluation  Patient identified by MRN, date of birth, ID band Patient awake    Reviewed: Allergy & Precautions, H&P , NPO status , Patient's Chart, lab work & pertinent test results  Airway Mallampati: II TM Distance: >3 FB Neck ROM: Full    Dental  (+) Teeth Intact and Dental Advisory Given   Pulmonary Current Smoker,  breath sounds clear to auscultation  Pulmonary exam normal       Cardiovascular negative cardio ROS  Rhythm:Regular Rate:Normal     Neuro/Psych negative neurological ROS  negative psych ROS   GI/Hepatic negative GI ROS, Neg liver ROS,   Endo/Other  negative endocrine ROS  Renal/GU negative Renal ROS  negative genitourinary   Musculoskeletal negative musculoskeletal ROS (+)   Abdominal   Peds  Hematology negative hematology ROS (+)   Anesthesia Other Findings   Reproductive/Obstetrics negative OB ROS                           Anesthesia Physical Anesthesia Plan  ASA: I and emergent  Anesthesia Plan: General   Post-op Pain Management:    Induction: Intravenous  Airway Management Planned: LMA  Additional Equipment:   Intra-op Plan:   Post-operative Plan: Extubation in OR  Informed Consent: I have reviewed the patients History and Physical, chart, labs and discussed the procedure including the risks, benefits and alternatives for the proposed anesthesia with the patient or authorized representative who has indicated his/her understanding and acceptance.   Dental advisory given  Plan Discussed with: CRNA  Anesthesia Plan Comments:         Anesthesia Quick Evaluation

## 2012-09-13 ENCOUNTER — Encounter (HOSPITAL_COMMUNITY): Payer: Self-pay | Admitting: Urology

## 2012-09-13 DIAGNOSIS — D649 Anemia, unspecified: Secondary | ICD-10-CM

## 2012-09-13 DIAGNOSIS — E876 Hypokalemia: Secondary | ICD-10-CM

## 2012-09-13 LAB — BASIC METABOLIC PANEL
Chloride: 105 mEq/L (ref 96–112)
Creatinine, Ser: 0.54 mg/dL (ref 0.50–1.10)
GFR calc Af Amer: >90 mL/min (ref >90)
Potassium: 3.3 mEq/L — ABNORMAL LOW (ref 3.5–5.1)
Sodium: 137 mEq/L (ref 135–145)

## 2012-09-13 LAB — GLUCOSE, CAPILLARY
Glucose-Capillary: 99 mg/dL (ref 70–99)
Glucose-Capillary: 89 mg/dL (ref 70–99)
Glucose-Capillary: 135 mg/dL — ABNORMAL HIGH (ref 70–99)

## 2012-09-13 LAB — URINALYSIS, ROUTINE W REFLEX MICROSCOPIC
Glucose, UA: NEGATIVE mg/dL
Ketones, ur: NEGATIVE mg/dL
Protein, ur: 30 mg/dL — AB

## 2012-09-13 LAB — URINE CULTURE

## 2012-09-13 LAB — CBC
Platelets: 178 10*3/uL (ref 150–400)
RDW: 12.5 % (ref 11.5–15.5)
WBC: 6.7 10*3/uL (ref 4.0–10.5)

## 2012-09-13 MED ORDER — ZOLPIDEM TARTRATE 5 MG PO TABS
5.0000 mg | ORAL_TABLET | Freq: Every evening | ORAL | Status: DC | PRN
  Administered 2012-09-13: 5 mg via ORAL
  Filled 2012-09-13: qty 1

## 2012-09-13 MED ORDER — CIPROFLOXACIN HCL 500 MG PO TABS
500.0000 mg | ORAL_TABLET | Freq: Two times a day (BID) | ORAL | Status: DC
  Administered 2012-09-13 – 2012-09-14 (×2): 500 mg via ORAL
  Filled 2012-09-13 (×4): qty 1

## 2012-09-13 NOTE — Progress Notes (Signed)
[facility] PROGRESS NOTE  LISSETH BRAZEAU ZOX:[id number] DOB: Nov 25, 1985 DOA: 09/11/2012 PCP: No primary provider on file.  Assessment/Plan  UTI, pyelonephritis -  Urine culture grew S. Viridans which may be contaminant from GU flora -  Spoke with Dr. Drue Second, ID, who recommended narrowing coverage to cipro -  Add repeat UA and UCx -  F/u tomorrow  Right distal ureteral calculus s/p cystoscopy with double-J stent placement -  Appreciate urology recommendations  Persistent nausea and vomiting and poor PO intake yesterday -  Monitor oral intake today  -  Control pain   Hypokalemia:  LIkely due to poor oral intake and IVF without potassium -  Replete with IV potassium  Normocytic anemia:  Likely acute marrow suppression from pyelo -  Trend hgb  Diet:  Tolerated clears for breakfast, advance as tolerated Access:  PIV IVF:  NS at 113ml/h Proph:  SCDs   Code Status: full code Family Communication: spoke with patient and fiance Disposition Plan: pending tolerating diet and oral pain medications, possible home in AM if cipro available (if pharmacies are open)   Consultants:  Urology  Procedures:  Double J stent placement on 2/11  Antibiotics:  Ceftriaxone 2/10 x 1  Zosyn 2/11 >>2/12   Cipro 2/12 >>   HPI/Subjective: Patient states that she continues to have right sided abdominal and flank pain, however, it is improved and she is starting to require less frequent IV pain medidcation (previously received IV q2h).  Mild nausea, but no recent vomiting.  Denies constipation and diarrhea.  Fevers resolved.    Objective: Filed Vitals:   09/12/12 2305 09/13/12 0019 09/13/12 0409 09/13/12 1459  BP: 122/56 112/66 101/59 112/73  Pulse: 70 77 67 69  Temp: 98.6 F (37 C) 97.6 F (36.4 C) 97.8 F (36.6 C)   TempSrc: Oral Oral Oral   Resp: 13 18 20 18   Height:      Weight:      SpO2: 98% 100% 91% 99%    Intake/Output Summary (Last 24 hours) at 09/13/12 1910 Last  data filed at 09/13/12 1840  Gross per 24 hour  Intake   4010 ml  Output   2700 ml  Net   1310 ml   Filed Weights   09/12/12 0131  Weight: 74.707 kg (164 lb 11.2 oz)    Exam:   General:  CF, no acute distress, lying in bed, crying due to pain  HEENT:  MMM  Cardiovascular:  RRR, no mrg, 2+ pulses, warm extremities  Respiratory:  CTAB, no increased WOB  Abdomen:  NABS, TTP in the RUQ and RLQ without rebound or guarding.  Soft, ND  MSK:  Nl tone and bulk, no LEE  Neuro:  Grossly intact  Data Reviewed: Basic Metabolic Panel:  Recent Labs Lab 09/11/12 2230 09/12/12 0456 09/13/12 0524  NA 134* 139 137  K 3.2* 3.3* 3.3*  CL 99 108 105  CO2 23 23 23   GLUCOSE 78 89 97  BUN 6 6 3*  CREATININE 0.60 0.60 0.54  CALCIUM 8.8 7.9* 8.1*   Liver Function Tests: No results found for this basename: AST, ALT, ALKPHOS, BILITOT, PROT, ALBUMIN,  in the last 168 hours No results found for this basename: LIPASE, AMYLASE,  in the last 168 hours No results found for this basename: AMMONIA,  in the last 168 hours CBC:  Recent Labs Lab 09/11/12 2230 09/12/12 0456 09/13/12 0524  WBC 10.3 7.0 6.7  NEUTROABS 8.4*  --   --  HGB 12.8 10.9* 10.8*  HCT 37.8 32.1* 32.8*  MCV 91.3 92.8 93.2  PLT 217 163 178   Cardiac Enzymes: No results found for this basename: CKTOTAL, CKMB, CKMBINDEX, TROPONINI,  in the last 168 hours BNP (last 3 results) No results found for this basename: PROBNP,  in the last 8760 hours CBG:  Recent Labs Lab 09/13/12 0016 09/13/12 0407 09/13/12 0720 09/13/12 1218 09/13/12 1722  GLUCAP 108* 99 89 116* 135*    Recent Results (from the past 240 hour(s))  URINE CULTURE     Status: None   Collection Time    09/11/12  8:37 PM      Result Value Range Status   Specimen Description URINE, CLEAN CATCH   Final   Special Requests NONE   Final   Culture  Setup Time 09/12/2012 04:04   Final   Colony Count >=100,000 COLONIES/ML   Final   Culture VIRIDANS  STREPTOCOCCUS   Final   Report Status 09/13/2012 FINAL   Final  SURGICAL PCR SCREEN     Status: None   Collection Time    09/12/12 10:38 AM      Result Value Range Status   MRSA, PCR NEGATIVE  NEGATIVE Final   Staphylococcus aureus NEGATIVE  NEGATIVE Final   Comment:            The Xpert SA Assay (FDA     approved for NASAL specimens     in patients over 65 years of age),     is one component of     a comprehensive surveillance     program.  Test performance has     been validated by [employer] for patients greater     than or equal to 41 year old.     It is not intended     to diagnose infection nor to     guide or monitor treatment.     Studies: Ct Abdomen Pelvis Wo Contrast  09/11/2012  *RADIOLOGY REPORT*  Clinical Data: Right flank pain radiating to the back.  History renal calculi.  Body aches and fever.  CT ABDOMEN AND PELVIS WITHOUT CONTRAST  Technique:  Multidetector CT imaging of the abdomen and pelvis was performed following the standard protocol without intravenous contrast.  Comparison: 04/22/2006  Findings: Mild dependent and right infrahilar subsegmental atelectasis noted.  The visualized portion of the liver, spleen, pancreas, and adrenal glands appear unremarkable in noncontrast CT appearance.  The gallbladder and biliary system appear unremarkable.  No pathologic retroperitoneal or porta hepatis adenopathy is identified.  Right hydronephrosis noted with hydroureter extending down to a 10 mm stone is proximal to the iliac vessel crossover.  The right ureter distal to this point is of normal caliber.  There is a 4 mm nonobstructive right kidney lower pole calculus and a separate 2 mm nonobstructive right kidney lower pole calculus.  No left-sided calculi observed.  Urinary bladder not distended but unremarkable.  Appendix normal. No pathologic pelvic adenopathy is identified.  The uterus and adnexa appear unremarkable.  No free pelvic fluid.  IMPRESSION:  1.  Right  hydronephrosis and hydroureter extending down to a 10 x 12 x 8 mm right mid ureteral calculus, just proximal to the iliac vessel crossover. 2.  Nonobstructive right kidney lower pole nephrolithiasis. 3.  Subsegmental atelectasis in the lung bases.   Original Report Authenticated By: Gaylyn Rong, M.D.     Scheduled Meds: . ciprofloxacin  500 mg Oral BID  .  sodium chloride  3 mL Intravenous Q12H   Continuous Infusions: . sodium chloride 125 mL/hr at 09/13/12 1334    Principal Problem:   Pyelonephritis Active Problems:   Hydronephrosis   Tobacco abuse   Hypokalemia   Normocytic anemia    Time spent: 30 min    Nixie Laube, [facility]  [facility] Pager [phone]. If 7PM-7AM, please contact night-coverage at [email or web address], password [facility] 09/13/2012, 7:10 PM  LOS: 2 days

## 2012-09-13 NOTE — Progress Notes (Signed)
Patient ID: Edison Nasuti, female   DOB: Aug 30, 1985, 27 y.o.   MRN: [id number] 1 Day Post-Op  Subjective: Atziri is felling better since the stent was placed yesterday and she remains afebrile.  She did have some nausea last night with vomiting and has some urinary urgency with the stent.  ROS: Negative except as above.  Objective: Vital signs in last 24 hours: Temp:  [97.6 F (36.4 C)-98.6 F (37 C)] 97.8 F (36.6 C) (02/12 0409) Pulse Rate:  [64-88] 67 (02/12 0409) Resp:  [11-20] 20 (02/12 0409) BP: (101-122)/(56-98) 101/59 mmHg (02/12 0409) SpO2:  [91 %-100 %] 91 % (02/12 0409)  Intake/Output from previous day: 02/11 0701 - 02/12 0700 In: 2581.7 [P.O.:440; I.V.:1891.7; IV Piggyback:250] Out: 2200 [Urine:2200] Intake/Output this shift:    General appearance: alert and no distress GI: She has moderate RCVA and RLQ tenderness with guarding.   Lab Results:   Recent Labs  09/12/12 0456 09/13/12 0524  WBC 7.0 6.7  HGB 10.9* 10.8*  HCT 32.1* 32.8*  PLT 163 178   BMET  Recent Labs  09/12/12 0456 09/13/12 0524  NA 139 137  K 3.3* 3.3*  CL 108 105  CO2 23 23  GLUCOSE 89 97  BUN 6 3*  CREATININE 0.60 0.54  CALCIUM 7.9* 8.1*   PT/INR No results found for this basename: LABPROT, INR,  in the last 72 hours ABG No results found for this basename: PHART, PCO2, PO2, HCO3,  in the last 72 hours  Studies/Results: Ct Abdomen Pelvis Wo Contrast  09/11/2012  *RADIOLOGY REPORT*  Clinical Data: Right flank pain radiating to the back.  History renal calculi.  Body aches and fever.  CT ABDOMEN AND PELVIS WITHOUT CONTRAST  Technique:  Multidetector CT imaging of the abdomen and pelvis was performed following the standard protocol without intravenous contrast.  Comparison: 04/22/2006  Findings: Mild dependent and right infrahilar subsegmental atelectasis noted.  The visualized portion of the liver, spleen, pancreas, and adrenal glands appear unremarkable in noncontrast CT  appearance.  The gallbladder and biliary system appear unremarkable.  No pathologic retroperitoneal or porta hepatis adenopathy is identified.  Right hydronephrosis noted with hydroureter extending down to a 10 mm stone is proximal to the iliac vessel crossover.  The right ureter distal to this point is of normal caliber.  There is a 4 mm nonobstructive right kidney lower pole calculus and a separate 2 mm nonobstructive right kidney lower pole calculus.  No left-sided calculi observed.  Urinary bladder not distended but unremarkable.  Appendix normal. No pathologic pelvic adenopathy is identified.  The uterus and adnexa appear unremarkable.  No free pelvic fluid.  IMPRESSION:  1.  Right hydronephrosis and hydroureter extending down to a 10 x 12 x 8 mm right mid ureteral calculus, just proximal to the iliac vessel crossover. 2.  Nonobstructive right kidney lower pole nephrolithiasis. 3.  Subsegmental atelectasis in the lung bases.   Original Report Authenticated By: Gaylyn Rong, M.D.     Anti-infectives: Anti-infectives   Start     Dose/Rate Route Frequency Ordered Stop   09/12/12 0145  piperacillin-tazobactam (ZOSYN) IVPB 3.375 g     3.375 g 12.5 mL/hr over 240 Minutes Intravenous 3 times per day 09/12/12 0140     09/11/12 2130  cefTRIAXone (ROCEPHIN) 1 g in dextrose 5 % 50 mL IVPB     1 g 100 mL/hr over 30 Minutes Intravenous  Once 09/11/12 2122 09/11/12 2204      Current Facility-Administered Medications  Medication  Dose Route Frequency Provider Last Rate Last Dose  . 0.9 %  sodium chloride infusion   Intravenous Continuous Eduard Clos, MD 125 mL/hr at 09/13/12 0547    . acetaminophen (TYLENOL) tablet 650 mg  650 mg Oral Q6H PRN Eduard Clos, MD       Or  . acetaminophen (TYLENOL) suppository 650 mg  650 mg Rectal Q6H PRN Eduard Clos, MD      . HYDROmorphone (DILAUDID) injection 0.5 mg  0.5 mg Intravenous Q2H PRN Eduard Clos, MD   0.5 mg at 09/13/12 0555   . ondansetron (ZOFRAN) tablet 4 mg  4 mg Oral Q6H PRN Eduard Clos, MD       Or  . ondansetron [facility]) injection 4 mg  4 mg Intravenous Q6H PRN Eduard Clos, MD   4 mg at 09/12/12 2117  . piperacillin-tazobactam (ZOSYN) IVPB 3.375 g  3.375 g Intravenous Q8H Eduard Clos, MD   3.375 g at 09/13/12 0547  . sodium chloride 0.9 % injection 3 mL  3 mL Intravenous Q12H Eduard Clos, MD   3 mL at 09/12/12 0145   I have reviewed the OP note from yesterday.   Urine culture grew >100K strep viridans  Assessment: s/p Procedure(s): CYSTOSCOPY WITH STENT PLACEMENT  She is improved post stenting but still has some pain which is consistent with a pyelonephritis.   Plan: She is going to need ureteroscopic stone extraction.  The stone is over the pelvis and appears quite dense, so I think ureteroscopy with laser will be more effected than ESWL.  I have reviewed the risks of the procedure including the possible need for additional procedures and further stent drainage. I will have my office schedule the procedure in about 7-10 days.  Adjust antibiotics according to the culture.  She could probably be placed on po amoxicillin.      LOS: 2 days    Anner Crete 09/13/2012

## 2012-09-13 NOTE — Care Management (Signed)
CM spoke with patient concerning discharge planning. No PCP recorded. Pt provided with information concerning PCP referrals. Pt stated being employed. Per pt no medication assistance needed. Pt states speaking with financial advisor during coarse of hospital stay. No other needs noted.   Deleon Passe,RN,BSN [phone] 

## 2012-09-14 LAB — GLUCOSE, CAPILLARY: Glucose-Capillary: 107 mg/dL — ABNORMAL HIGH (ref 70–99)

## 2012-09-14 MED ORDER — PHENAZOPYRIDINE HCL 200 MG PO TABS
200.0000 mg | ORAL_TABLET | Freq: Three times a day (TID) | ORAL | Status: DC | PRN
  Filled 2012-09-14 (×3): qty 1

## 2012-09-14 MED ORDER — OXYCODONE-ACETAMINOPHEN 5-325 MG PO TABS: 1.0000 | ORAL_TABLET | ORAL | Status: DC | PRN

## 2012-09-14 MED ORDER — CIPROFLOXACIN HCL 500 MG PO TABS: 500.0000 mg | ORAL_TABLET | Freq: Two times a day (BID) | ORAL | Status: DC

## 2012-09-14 MED ORDER — PHENAZOPYRIDINE HCL 200 MG PO TABS: 200.0000 mg | ORAL_TABLET | Freq: Three times a day (TID) | ORAL | Status: DC | PRN

## 2012-09-14 MED ORDER — POTASSIUM CHLORIDE ER 10 MEQ PO TBCR: 20.0000 meq | EXTENDED_RELEASE_TABLET | Freq: Every day | ORAL | Status: DC

## 2012-09-14 NOTE — Progress Notes (Signed)
Ur completed     

## 2012-09-14 NOTE — Progress Notes (Signed)
Patient ID: Dominique Perry, female   DOB: 24-Apr-1986, 27 y.o.   MRN: [id number] 2 Days Post-Op  Subjective: Dominique Perry is improving and has reduced pain.  She has some urgency and pressure from the stent.  She remains afebrile and is on cipro for the strep viridens. ROS: Negative except as above.  She has no more nausea.   Objective: Vital signs in last 24 hours: Temp:  [97.9 F (36.6 C)-98.2 F (36.8 C)] 98.2 F (36.8 C) (02/13 0605) Pulse Rate:  [66-73] 66 (02/13 0605) Resp:  [18] 18 (02/13 0605) BP: (109-113)/(68-73) 113/68 mmHg (02/13 0605) SpO2:  [97 %-99 %] 97 % (02/13 0605)  Intake/Output from previous day: 02/12 0701 - 02/13 0700 In: 3170 [P.O.:1170; I.V.:2000] Out: 2600 [Urine:2600] Intake/Output this shift: Total I/O In: 1037.5 [I.V.:1037.5] Out: -   General appearance: alert and no distress Resp: clear to auscultation bilaterally Cardio: regular rate and rhythm GI: soft with reduced RLQ and RCVA tenderness.  + BS.   Lab Results:   Recent Labs  09/12/12 0456 09/13/12 0524  WBC 7.0 6.7  HGB 10.9* 10.8*  HCT 32.1* 32.8*  PLT 163 178   BMET  Recent Labs  09/12/12 0456 09/13/12 0524  NA 139 137  K 3.3* 3.3*  CL 108 105  CO2 23 23  GLUCOSE 89 97  BUN 6 3*  CREATININE 0.60 0.54  CALCIUM 7.9* 8.1*   PT/INR No results found for this basename: LABPROT, INR,  in the last 72 hours ABG No results found for this basename: PHART, PCO2, PO2, HCO3,  in the last 72 hours  Studies/Results: No results found.  Anti-infectives: Anti-infectives   Start     Dose/Rate Route Frequency Ordered Stop   09/13/12 2000  ciprofloxacin (CIPRO) tablet 500 mg     500 mg Oral 2 times daily 09/13/12 1617     09/12/12 0145  piperacillin-tazobactam (ZOSYN) IVPB 3.375 g  Status:  Discontinued     3.375 g 12.5 mL/hr over 240 Minutes Intravenous 3 times per day 09/12/12 0140 09/13/12 1617   09/11/12 2130  cefTRIAXone (ROCEPHIN) 1 g in dextrose 5 % 50 mL IVPB     1 g 100 mL/hr  over 30 Minutes Intravenous  Once 09/11/12 2122 09/11/12 2204      Current Facility-Administered Medications  Medication Dose Route Frequency Provider Last Rate Last Dose  . 0.9 %  sodium chloride infusion   Intravenous Continuous Eduard Clos, MD 125 mL/hr at 09/14/12 0544    . acetaminophen (TYLENOL) tablet 650 mg  650 mg Oral Q6H PRN Eduard Clos, MD   650 mg at 09/13/12 1340   Or  . acetaminophen (TYLENOL) suppository 650 mg  650 mg Rectal Q6H PRN Eduard Clos, MD      . ciprofloxacin (CIPRO) tablet 500 mg  500 mg Oral BID Renae Fickle, MD   500 mg at 09/14/12 0729  . HYDROmorphone (DILAUDID) injection 0.5 mg  0.5 mg Intravenous Q2H PRN Eduard Clos, MD   0.5 mg at 09/14/12 0920  . ondansetron (ZOFRAN) tablet 4 mg  4 mg Oral Q6H PRN Eduard Clos, MD   4 mg at 09/13/12 2019   Or  . ondansetron (ZOFRAN) injection 4 mg  4 mg Intravenous Q6H PRN Eduard Clos, MD   4 mg at 09/12/12 2117  . phenazopyridine (PYRIDIUM) tablet 200 mg  200 mg Oral TID WC PRN Anner Crete, MD      . sodium chloride  0.9 % injection 3 mL  3 mL Intravenous Q12H Eduard Clos, MD   3 mL at 09/12/12 0145  . zolpidem (AMBIEN) tablet 5 mg  5 mg Oral QHS PRN Renae Fickle, MD   5 mg at 09/13/12 2015    Assessment: s/p Procedure(s): CYSTOSCOPY WITH STENT PLACEMENT  She is continuing to improve but is having some stent irritation.   Plan: She has been scheduled for 2/25 for definitive ureteroscopic stone extraction.  Ok to D/C from my standpoint.    She will need pyridium for bladder relief.      LOS: 3 days    Dominique Perry 09/14/2012

## 2012-09-14 NOTE — Discharge Summary (Signed)
Physician Discharge Summary  TORIN WHISNER RUE:[id number] DOB: [birth year] DOA: 09/11/2012  PCP: No primary provider on file.  Admit date: 09/11/2012 Discharge date: 09/14/2012  Recommendations for Outpatient Follow-up:  Follow up with urology on 2/25 for definitive ureteroscopic stone extraction.  Discharge Diagnoses:  Principal Problem:   Pyelonephritis Active Problems:   Hydronephrosis   Tobacco abuse   Hypokalemia   Normocytic anemia   Discharge Condition: stable, improved  Diet recommendation: healthy heart  Wt Readings from Last 3 Encounters:  09/12/12 74.707 kg (164 lb 11.2 oz)  09/12/12 74.707 kg (164 lb 11.2 oz)    History of present illness:   27 year-old female with history of nephrolithiasis presented the ER because of fever and chills. Patient has also been having nausea vomiting with right flank pain over the last 2 days. Patient states that she does have chronic right flank pain which had worsened last 2 days. Denies any diarrhea shortness of breath or productive cough. In the ER CT abdomen pelvis show right-sided nephrosis with mid ureter calculus. On-call urologist Dr. Wilson Singer was contacted and at this time patient will be admitted for further management.    Hospital Course:   Ms. Leaton was admitted with a large right distal ureteral calculus.  She was evaluated by urology and underwent double-J stent placement on 2/11, which she tolerated well.  She will need follow up with urology in a few weeks for stone removal.  Repeat UA demonstrated many RBC, likely due to her recent procedure, however, she did not have gross hematuria.    Pyelonephritis:  Her urinalysis at admission was concerning for urinary tract infection and she was started initially on ceftriaxone by the ER and then zosyn by the hospitalist team.  She was febrile to 102.49F at admission and appeared ill and in pain, however, she rapidly defervesces and clincally improved.  Her urine culture grew >100000  colonies of S. Viridans.  I spoke with infectious disease about the results of the culture and they recommended narrowing coverage to ciprofloxacin and repeating her urinalysis and culture.  The S. Viridans was felt to be due to contaminant from the GU/vulvar area, masking the underlying infection.  Her follow up UA demonstrated less LE and fewer WBC.  Her culture is pending at the time of discharge.  She was started on pyridium by Urology for symptom relief.    Persistent nausea and vomiting and poor PO intake persisted during POD 1, but today, she is eating well.  Her pain is also better controlled and she is not requiring frequent IV pain medication.    Hypokalemia: LIkely due to poor oral intake and IVF without potassium.  Repleted with oral IV potassium.  Will give her a prescription for low dose potassium for the next week to make sure she is fully repleted.  She will need repeat electrolytes in 1-2 weeks.     Normocytic anemia: Likely acute marrow suppression from pyelo.  Her hgb remained stable.   She will need a repeat CBC in one week to verify that her counts are recovering.     Procedures:  Double J-stent 2/11  Consultations:  Urology  Discharge Exam: Filed Vitals:   09/14/12 0605  BP: 113/68  Pulse: 66  Temp: 98.2 F (36.8 C)  Resp: 18   Filed Vitals:   09/13/12 0409 09/13/12 1459 09/14/12 0001 09/14/12 0605  BP: 101/59 112/73 109/72 113/68  Pulse: 67 69 73 66  Temp: 97.8 F (36.6 C)  97.9 F (  36.6 C) 98.2 F (36.8 C)  TempSrc: Oral  Oral Oral  Resp: 20 18 18 18   Height:      Weight:      SpO2: 91% 99% 99% 97%     General: CF, no acute distress HEENT: MMM  Cardiovascular: RRR, no mrg, 2+ pulses, warm extremities  Respiratory: CTAB, no increased WOB  Abdomen: NABS, TTP in the RUQ and RLQ without rebound or guarding. Soft, ND  MSK: Nl tone and bulk, no LEE. Mild right flank pain Neuro: Grossly intact   Discharge Instructions      Discharge Orders    Future Orders Complete By Expires     Call MD for:  difficulty breathing, headache or visual disturbances  As directed     Call MD for:  extreme fatigue  As directed     Call MD for:  hives  As directed     Call MD for:  persistant dizziness or light-headedness  As directed     Call MD for:  persistant nausea and vomiting  As directed     Call MD for:  severe uncontrolled pain  As directed     Call MD for:  temperature >100.4  As directed     Diet general  As directed     Discharge instructions  As directed     Comments:      You were hospitalized with a kidney stone obstructing your right kidney.  You had a stent placed by urology to drain the urine from your kidney, however, you will need to have the stone removed at a future date.  You were also found to have a urinary tract infection that was treated for 3 days with IV antibiotics.  You will need to complete a 7-day course of antibiotics with ciprofloxacin.  Please take the medication twice a day until all the tabs are gone.  Your potassium was low so please continue oral potassium for the next week, and you were very mildly anemic.  Your primary care doctor can follow up with results of your repeat urine culture and repeat your blood work to make sure your potassium and anemia are improving in 1-2 weeks.  Feel better!    Increase activity slowly  As directed         Medication List    TAKE these medications       acetaminophen 325 MG tablet  Commonly known as:  TYLENOL  Take 650 mg by mouth every 6 (six) hours as needed for pain.     ciprofloxacin 500 MG tablet  Commonly known as:  CIPRO  Take 1 tablet (500 mg total) by mouth 2 (two) times daily.     oxyCODONE-acetaminophen 5-325 MG per tablet  Commonly known as:  PERCOCET/ROXICET  Take 1 tablet by mouth every 4 (four) hours as needed for pain.     phenazopyridine 200 MG tablet  Commonly known as:  PYRIDIUM  Take 1 tablet (200 mg total) by mouth 3 (three) times daily as needed  for pain.     potassium chloride 10 MEQ tablet  Commonly known as:  K-DUR  Take 2 tablets (20 mEq total) by mouth daily.       Follow-up Information   Follow up with Anner Crete, MD On 09/26/2012. (930 per office instructions. )    Contact information:   [address] [place] [address] [phone]       Follow up with primary care doctor. Schedule  an appointment as soon as possible for a visit in 1 week. (repeat blood work, follow up urine culture)        The results of significant diagnostics from this hospitalization (including imaging, microbiology, ancillary and laboratory) are listed below for reference.    Significant Diagnostic Studies: Ct Abdomen Pelvis Wo Contrast  09/11/2012  *RADIOLOGY REPORT*  Clinical Data: Right flank pain radiating to the back.  History renal calculi.  Body aches and fever.  CT ABDOMEN AND PELVIS WITHOUT CONTRAST  Technique:  Multidetector CT imaging of the abdomen and pelvis was performed following the standard protocol without intravenous contrast.  Comparison: 04/22/2006  Findings: Mild dependent and right infrahilar subsegmental atelectasis noted.  The visualized portion of the liver, spleen, pancreas, and adrenal glands appear unremarkable in noncontrast CT appearance.  The gallbladder and biliary system appear unremarkable.  No pathologic retroperitoneal or porta hepatis adenopathy is identified.  Right hydronephrosis noted with hydroureter extending down to a 10 mm stone is proximal to the iliac vessel crossover.  The right ureter distal to this point is of normal caliber.  There is a 4 mm nonobstructive right kidney lower pole calculus and a separate 2 mm nonobstructive right kidney lower pole calculus.  No left-sided calculi observed.  Urinary bladder not distended but unremarkable.  Appendix normal. No pathologic pelvic adenopathy is identified.  The uterus and adnexa appear unremarkable.  No free pelvic fluid.  IMPRESSION:  1.   Right hydronephrosis and hydroureter extending down to a 10 x 12 x 8 mm right mid ureteral calculus, just proximal to the iliac vessel crossover. 2.  Nonobstructive right kidney lower pole nephrolithiasis. 3.  Subsegmental atelectasis in the lung bases.   Original Report Authenticated By: Gaylyn Rong, M.D.     Microbiology: Recent Results (from the past 240 hour(s))  URINE CULTURE     Status: None   Collection Time    09/11/12  8:37 PM      Result Value Range Status   Specimen Description URINE, CLEAN CATCH   Final   Special Requests NONE   Final   Culture  Setup Time 09/12/2012 04:04   Final   Colony Count >=100,000 COLONIES/ML   Final   Culture VIRIDANS STREPTOCOCCUS   Final   Report Status 09/13/2012 FINAL   Final  SURGICAL PCR SCREEN     Status: None   Collection Time    09/12/12 10:38 AM      Result Value Range Status   MRSA, PCR NEGATIVE  NEGATIVE Final   Staphylococcus aureus NEGATIVE  NEGATIVE Final   Comment:            The Xpert SA Assay (FDA     approved for NASAL specimens     in patients over 70 years of age),     is one component of     a comprehensive surveillance     program.  Test performance has     been validated by [employer] for patients greater     than or equal to 22 year old.     It is not intended     to diagnose infection nor to     guide or monitor treatment.     Labs: Basic Metabolic Panel:  Recent Labs Lab 09/11/12 2230 09/12/12 0456 09/13/12 0524  NA 134* 139 137  K 3.2* 3.3* 3.3*  CL 99 108 105  CO2 23 23 23   GLUCOSE 78 89 97  BUN  6 6 3*  CREATININE 0.60 0.60 0.54  CALCIUM 8.8 7.9* 8.1*   Liver Function Tests: No results found for this basename: AST, ALT, ALKPHOS, BILITOT, PROT, ALBUMIN,  in the last 168 hours No results found for this basename: LIPASE, AMYLASE,  in the last 168 hours No results found for this basename: AMMONIA,  in the last 168 hours CBC:  Recent Labs Lab 09/11/12 2230 09/12/12 0456  09/13/12 0524  WBC 10.3 7.0 6.7  NEUTROABS 8.4*  --   --   HGB 12.8 10.9* 10.8*  HCT 37.8 32.1* 32.8*  MCV 91.3 92.8 93.2  PLT 217 163 178   Cardiac Enzymes: No results found for this basename: CKTOTAL, CKMB, CKMBINDEX, TROPONINI,  in the last 168 hours BNP: BNP (last 3 results) No results found for this basename: PROBNP,  in the last 8760 hours CBG:  Recent Labs Lab 09/13/12 0720 09/13/12 1218 09/13/12 1722 09/14/12 09/14/12 0603  GLUCAP 89 116* 135* 125* 107*    Time coordinating discharge: 35 minutes  Signed:  Issabela Lesko  [facility] 09/14/2012, 11:10 AM

## 2012-09-15 LAB — URINE CULTURE
Colony Count: NO GROWTH
Culture: NO GROWTH

## 2012-09-18 ENCOUNTER — Encounter (HOSPITAL_COMMUNITY): Payer: Self-pay | Admitting: *Deleted

## 2012-09-18 ENCOUNTER — Emergency Department (INDEPENDENT_AMBULATORY_CARE_PROVIDER_SITE_OTHER)
Admission: EM | Admit: 2012-09-18 | Discharge: 2012-09-18 | Disposition: A | Discharge status: Home / Self Care | Payer: Self-pay | Source: Home / Self Care | Attending: Emergency Medicine | Admitting: Emergency Medicine

## 2012-09-18 DIAGNOSIS — D649 Anemia, unspecified: Secondary | ICD-10-CM

## 2012-09-18 DIAGNOSIS — E876 Hypokalemia: Secondary | ICD-10-CM

## 2012-09-18 DIAGNOSIS — N10 Acute tubulo-interstitial nephritis: Secondary | ICD-10-CM

## 2012-09-18 DIAGNOSIS — N200 Calculus of kidney: Secondary | ICD-10-CM

## 2012-09-18 LAB — POCT URINALYSIS DIP (DEVICE)
Bilirubin Urine: NEGATIVE
Glucose, UA: 100 mg/dL — AB
Ketones, ur: NEGATIVE mg/dL
Specific Gravity, Urine: 1.01 (ref 1.005–1.030)

## 2012-09-18 LAB — POCT I-STAT, CHEM 8
Chloride: 102 mEq/L (ref 96–112)
Creatinine, Ser: 0.8 mg/dL (ref 0.50–1.10)
Glucose, Bld: 91 mg/dL (ref 70–99)
Hemoglobin: 13.6 g/dL (ref 12.0–15.0)
Potassium: 3.7 mEq/L (ref 3.5–5.1)
Sodium: 139 mEq/L (ref 135–145)

## 2012-09-18 MED ORDER — OXYCODONE-ACETAMINOPHEN 5-325 MG PO TABS: ORAL_TABLET | ORAL | Status: DC

## 2012-09-18 MED ORDER — CEPHALEXIN 500 MG PO CAPS: 500.0000 mg | ORAL_CAPSULE | Freq: Three times a day (TID) | ORAL | Status: DC

## 2012-09-18 NOTE — ED Notes (Signed)
Pt  Seen   [facility]    1  Week  Ago     For  Kidney  Problems        Just  Finished  cipro           C/o  r flank pain  Radiating  To  abd  Pt states  Was  Told to  Come  To ucc  For  Recheck of blood/ urine

## 2012-09-18 NOTE — Progress Notes (Signed)
PREOP INSTRUCTIONS DISCUSSED WITH PT - INCLUDING BETASEPT SOAP SHOWERS / PRECAUTIONS. PT STATES SHE PLANS TO GO TO [facility] THIS AFTERNOON OR TOMORROW FOR FOLLOW UP AND LABS--SHE WAS DISCHARGED FROM [facility] ON THURS 09/14/12.

## 2012-09-18 NOTE — ED Provider Notes (Signed)
Chief Complaint  Patient presents with  . Flank Pain    History of Present Illness:   Dominique Perry is a 27 year old female who presented to the emergency room on February 10 with right flank pain and fever. She was found to have pyelonephritis and a kidney stone. She was hospitalized from February 10 of February 13. A stent was placed by Dr. Isabel Caprice. She was sent home on Cipro 500 mg twice a day for a week. Her urine culture eventually grew out viridans strep. Since her discharge she has not had any fever but has had some nausea and vomited once. She continues to have back and abdominal pain which is severe at times. She has dysuria and bloody urine. She's finished up the Cipro. She returns today for a followup urinalysis and culture as well as repeat lab work since her potassium was low at 3.3 and 3.2, and she had a mildly low hemoglobin at 10.9. She was sent home on potassium but was not told to take any iron supplements.  Review of Systems:  Other than noted above, the patient denies any of the following symptoms: General:  No fevers, chills, sweats, aches, or fatigue. GI:  No abdominal pain, back pain, nausea, vomiting, diarrhea, or constipation. GU:  No dysuria, frequency, urgency, hematuria, or incontinence. GYN:  No discharge, itching, vulvar pain or lesions, pelvic pain, or abnormal vaginal bleeding.  PMFSH:  Past medical history, family history, social history, meds, and allergies were reviewed.  Physical Exam:   Vital signs:  BP 119/83  Pulse 88  Temp(Src) 99 F (37.2 C) (Oral)  Resp 12  SpO2 100%  LMP 08/30/2012 Gen:  Alert, oriented, in no distress. Lungs:  Clear to auscultation, no wheezes, rales or rhonchi. Heart:  Regular rhythm, no gallop or murmer. Abdomen:  Moderate right flank pain without guarding or rebound. No organomegaly or mass. Bowel sounds are normally active.   Back:  Moderate right CVA tenderness.  Skin:  Clear, warm and dry.  Labs:   Results for orders  placed during the hospital encounter of 09/18/12  POCT URINALYSIS DIP (DEVICE)      Result Value Range   Glucose, UA 100 (*) NEGATIVE mg/dL   Bilirubin Urine NEGATIVE  NEGATIVE   Ketones, ur NEGATIVE  NEGATIVE mg/dL   Specific Gravity, Urine 1.010  1.005 - 1.030   Hgb urine dipstick LARGE (*) NEGATIVE   pH 6.0  5.0 - 8.0   Protein, ur >=300 (*) NEGATIVE mg/dL   Urobilinogen, UA 1.0  0.0 - 1.0 mg/dL   Nitrite POSITIVE (*) NEGATIVE   Leukocytes, UA SMALL (*) NEGATIVE  POCT I-STAT, CHEM 8      Result Value Range   Sodium 139  135 - 145 mEq/L   Potassium 3.7  3.5 - 5.1 mEq/L   Chloride 102  96 - 112 mEq/L   BUN 13  6 - 23 mg/dL   Creatinine, Ser 1.61  0.50 - 1.10 mg/dL   Glucose, Bld 91  70 - 99 mg/dL   Calcium, Ion 0.96 (*) 1.12 - 1.23 mmol/L   TCO2 29  0 - 100 mmol/L   Hemoglobin 13.6  12.0 - 15.0 g/dL   HCT 04.5  40.9 - 81.1 %     Other Labs Obtained at Urgent Care Center:  A urine culture was obtained.  Results are pending at this time and we will call about any positive results.  Assessment: The primary encounter diagnosis was Acute pyelonephritis. Diagnoses of  Nephrolith, Hypokalemia, and Anemia were also pertinent to this visit.   She is still symptomatic which is not too surprising given her severity of symptoms at presentation. I told her it may take a few weeks before the pain is completely gone away. Her potassium is back to normal I gave her a list of high potassium foods. Her anemia has reversed itself. She still needs something for pain. She plans to go back to see Dr. Isabel Caprice on February 25th for lithotripsy. Since her urine came back positive for viridans strep, I think a beta lactam antibiotic would be the best for this, so she was given a prescription for cephalexin to take for 10 days.  Plan:   1.  The following meds were prescribed:   Discharge Medication List as of 09/18/2012  6:56 PM    START taking these medications   Details  cephALEXin (KEFLEX) 500 MG capsule  Take 1 capsule (500 mg total) by mouth 3 (three) times daily., Starting 09/18/2012, Until Discontinued, Normal    !! oxyCODONE-acetaminophen (PERCOCET) 5-325 MG per tablet 1 to 2 tablets every 6 hours as needed for pain., Print     !! - Potential duplicate medications found. Please discuss with provider.     2.  The patient was instructed in symptomatic care and handouts were given. 3.  The patient was told to return if becoming worse in any way, if no better in 3 or 4 days, and given some red flag symptoms that would indicate earlier return. 4.  The patient was told to avoid intercourse for 10 days, get extra fluids, and return for a follow up with her primary care doctor at the completion of treatment for a repeat UA and culture.     Reuben Likes, MD 09/18/12 [phone]

## 2012-09-19 LAB — URINE CULTURE

## 2012-09-21 ENCOUNTER — Other Ambulatory Visit: Payer: Self-pay | Admitting: Urology

## 2012-09-25 MED ORDER — SODIUM CHLORIDE 0.9 % IV SOLN
1.5000 g | INTRAVENOUS | Status: AC
  Administered 2012-09-26: 1.5 g via INTRAVENOUS
  Filled 2012-09-25 (×2): qty 1.5

## 2012-09-26 ENCOUNTER — Ambulatory Visit (HOSPITAL_COMMUNITY)
Admission: RE | Admit: 2012-09-26 | Discharge: 2012-09-26 | Disposition: A | Discharge status: Home / Self Care | Payer: Self-pay | Source: Ambulatory Visit | Attending: Urology | Admitting: Urology

## 2012-09-26 ENCOUNTER — Encounter (HOSPITAL_COMMUNITY): Payer: Self-pay | Admitting: Anesthesiology

## 2012-09-26 ENCOUNTER — Ambulatory Visit (HOSPITAL_COMMUNITY): Payer: Self-pay | Admitting: Anesthesiology

## 2012-09-26 ENCOUNTER — Encounter (HOSPITAL_COMMUNITY)
Admission: RE | Disposition: A | Discharge status: Home / Self Care | Payer: Self-pay | Source: Ambulatory Visit | Attending: Urology

## 2012-09-26 ENCOUNTER — Encounter (HOSPITAL_COMMUNITY): Payer: Self-pay | Admitting: *Deleted

## 2012-09-26 DIAGNOSIS — N201 Calculus of ureter: Secondary | ICD-10-CM | POA: Insufficient documentation

## 2012-09-26 DIAGNOSIS — F17200 Nicotine dependence, unspecified, uncomplicated: Secondary | ICD-10-CM | POA: Insufficient documentation

## 2012-09-26 HISTORY — PX: CYSTOSCOPY/RETROGRADE/URETEROSCOPY/STONE EXTRACTION WITH BASKET: SHX5317

## 2012-09-26 HISTORY — PX: HOLMIUM LASER APPLICATION: SHX5852

## 2012-09-26 HISTORY — DX: Anemia, unspecified: D64.9

## 2012-09-26 LAB — SURGICAL PCR SCREEN: Staphylococcus aureus: NEGATIVE

## 2012-09-26 LAB — HCG, SERUM, QUALITATIVE: Preg, Serum: NEGATIVE

## 2012-09-26 SURGERY — CYSTOSCOPY, WITH CALCULUS REMOVAL USING BASKET
Anesthesia: General | Site: Ureter | Laterality: Right | Wound class: Clean Contaminated

## 2012-09-26 MED ORDER — FENTANYL CITRATE 0.05 MG/ML IJ SOLN: 25.0000 ug | INTRAMUSCULAR | Status: DC | PRN

## 2012-09-26 MED ORDER — OXYCODONE HCL 5 MG PO TABS
5.0000 mg | ORAL_TABLET | ORAL | Status: DC | PRN
  Administered 2012-09-26: 5 mg via ORAL
  Filled 2012-09-26: qty 1

## 2012-09-26 MED ORDER — LIDOCAINE HCL (CARDIAC) 20 MG/ML IV SOLN
INTRAVENOUS | Status: DC | PRN
  Administered 2012-09-26: 100 mg via INTRAVENOUS

## 2012-09-26 MED ORDER — SODIUM CHLORIDE 0.9 % IV SOLN
INTRAVENOUS | Status: AC
  Filled 2012-09-26: qty 1.5

## 2012-09-26 MED ORDER — ACETAMINOPHEN 10 MG/ML IV SOLN
INTRAVENOUS | Status: AC
  Filled 2012-09-26: qty 100

## 2012-09-26 MED ORDER — ACETAMINOPHEN 650 MG RE SUPP: 650.0000 mg | RECTAL | Status: DC | PRN

## 2012-09-26 MED ORDER — BELLADONNA ALKALOIDS-OPIUM 16.2-60 MG RE SUPP
RECTAL | Status: DC | PRN
  Administered 2012-09-26: 1 via RECTAL

## 2012-09-26 MED ORDER — SODIUM CHLORIDE 0.9 % IJ SOLN: 3.0000 mL | INTRAMUSCULAR | Status: DC | PRN

## 2012-09-26 MED ORDER — LACTATED RINGERS IV SOLN
INTRAVENOUS | Status: DC
  Administered 2012-09-26: 1000 mL via INTRAVENOUS

## 2012-09-26 MED ORDER — FENTANYL CITRATE 0.05 MG/ML IJ SOLN
INTRAMUSCULAR | Status: AC
  Filled 2012-09-26: qty 2

## 2012-09-26 MED ORDER — OXYCODONE-ACETAMINOPHEN 5-325 MG PO TABS: ORAL_TABLET | ORAL | Status: DC

## 2012-09-26 MED ORDER — ONDANSETRON HCL 4 MG/2ML IJ SOLN
INTRAMUSCULAR | Status: DC | PRN
  Administered 2012-09-26: 4 mg via INTRAVENOUS

## 2012-09-26 MED ORDER — SODIUM CHLORIDE 0.9 % IV SOLN: 250.0000 mL | INTRAVENOUS | Status: DC | PRN

## 2012-09-26 MED ORDER — MUPIROCIN 2 % EX OINT
TOPICAL_OINTMENT | Freq: Two times a day (BID) | CUTANEOUS | Status: DC
  Administered 2012-09-26: 1 via NASAL
  Filled 2012-09-26: qty 22

## 2012-09-26 MED ORDER — FENTANYL CITRATE 0.05 MG/ML IJ SOLN
INTRAMUSCULAR | Status: DC | PRN
  Administered 2012-09-26 (×4): 50 ug via INTRAVENOUS

## 2012-09-26 MED ORDER — SODIUM CHLORIDE 0.9 % IJ SOLN: 3.0000 mL | Freq: Two times a day (BID) | INTRAMUSCULAR | Status: DC

## 2012-09-26 MED ORDER — MEPERIDINE HCL 50 MG/ML IJ SOLN: 6.2500 mg | INTRAMUSCULAR | Status: DC | PRN

## 2012-09-26 MED ORDER — ACETAMINOPHEN 10 MG/ML IV SOLN
INTRAVENOUS | Status: DC | PRN
  Administered 2012-09-26: 1000 mg via INTRAVENOUS

## 2012-09-26 MED ORDER — PROMETHAZINE HCL 25 MG/ML IJ SOLN: 6.2500 mg | INTRAMUSCULAR | Status: DC | PRN

## 2012-09-26 MED ORDER — KETOROLAC TROMETHAMINE 30 MG/ML IJ SOLN
INTRAMUSCULAR | Status: DC | PRN
  Administered 2012-09-26: 30 mg via INTRAVENOUS

## 2012-09-26 MED ORDER — BELLADONNA ALKALOIDS-OPIUM 16.2-60 MG RE SUPP
RECTAL | Status: AC
  Filled 2012-09-26: qty 1

## 2012-09-26 MED ORDER — ONDANSETRON HCL 4 MG/2ML IJ SOLN: 4.0000 mg | Freq: Four times a day (QID) | INTRAMUSCULAR | Status: DC | PRN

## 2012-09-26 MED ORDER — PROPOFOL 10 MG/ML IV BOLUS
INTRAVENOUS | Status: DC | PRN
  Administered 2012-09-26: 200 mg via INTRAVENOUS

## 2012-09-26 MED ORDER — FENTANYL CITRATE 0.05 MG/ML IJ SOLN
25.0000 ug | INTRAMUSCULAR | Status: DC | PRN
  Administered 2012-09-26 (×3): 50 ug via INTRAVENOUS

## 2012-09-26 MED ORDER — SODIUM CHLORIDE 0.9 % IR SOLN
Status: DC | PRN
  Administered 2012-09-26: 4000 mL via INTRAVESICAL

## 2012-09-26 MED ORDER — ACETAMINOPHEN 325 MG PO TABS: 650.0000 mg | ORAL_TABLET | ORAL | Status: DC | PRN

## 2012-09-26 MED ORDER — MIDAZOLAM HCL 5 MG/5ML IJ SOLN
INTRAMUSCULAR | Status: DC | PRN
  Administered 2012-09-26: 2 mg via INTRAVENOUS

## 2012-09-26 SURGICAL SUPPLY — 16 items
BAG URO CATCHER STRL LF (DRAPE) ×2 IMPLANT
BASKET LASER NITINOL 1.9FR (BASKET) IMPLANT
BASKET ZERO TIP NITINOL 2.4FR (BASKET) ×2 IMPLANT
BSKT STON RTRVL 120 1.9FR (BASKET)
BSKT STON RTRVL ZERO TP 2.4FR (BASKET) ×1
CATH URET 5FR 28IN OPEN ENDED (CATHETERS) IMPLANT
CLOTH BEACON ORANGE TIMEOUT ST (SAFETY) ×2 IMPLANT
DRAPE CAMERA CLOSED 9X96 (DRAPES) ×2 IMPLANT
GLOVE SURG SS PI 8.0 STRL IVOR (GLOVE) ×2 IMPLANT
GOWN PREVENTION PLUS XLARGE (GOWN DISPOSABLE) ×2 IMPLANT
GOWN STRL REIN XL XLG (GOWN DISPOSABLE) ×2 IMPLANT
GUIDEWIRE STR DUAL SENSOR (WIRE) ×2 IMPLANT
LASER FIBER DISP (UROLOGICAL SUPPLIES) ×2 IMPLANT
MANIFOLD NEPTUNE II (INSTRUMENTS) ×2 IMPLANT
PACK CYSTO (CUSTOM PROCEDURE TRAY) ×2 IMPLANT
TUBING CONNECTING 10 (TUBING) ×2 IMPLANT

## 2012-09-26 NOTE — Interval H&P Note (Signed)
History and Physical Interval Note:  09/26/2012 10:55 AM  Dominique Perry  has presented today for surgery, with the diagnosis of Right Mid Ureteral Stone  The various methods of treatment have been discussed with the patient and family. After consideration of risks, benefits and other options for treatment, the patient has consented to  Procedure(s): RIGHT URETEROSCOPY/STONE EXTRACTION WITH STENT (Right) HOLMIUM LASER APPLICATION (Right) as a surgical intervention .  The patient's history has been reviewed, patient examined, no change in status, stable for surgery.  I have reviewed the patient's chart and labs.  Questions were answered to the patient's satisfaction.     Adanya Sosinski J

## 2012-09-26 NOTE — H&P (View-Only) (Signed)
Subjective: Dominique Perry is a 27 yo WF with a history of stones who has been having intermittant pain over the last few months.  She had the onset 2 days ago of more severe right flank pain with nausea and vomiting and a fever to 103.  She has had no voiding symptoms or hematuria.   She has no other GU history or associated signs or symptoms.  I was asked to see her in consultation by Dr. Benjamine Mola.  ROS: Negative except as above on a full 12 point review.   Past Medical History  Diagnosis Date  . Ureteral stone with hydronephrosis   . Pyelonephritis    Past Surgical History  Procedure Laterality Date  . Tubal ligation    . Wrist surgery     History   Social History  . Marital Status: Single    Spouse Name: N/A    Number of Children: N/A  . Years of Education: N/A   Occupational History  . Not on file.   Social History Main Topics  . Smoking status: Current Every Day Smoker    Types: Cigarettes  . Smokeless tobacco: Not on file  . Alcohol Use: Yes  . Drug Use: Yes    Special: Marijuana  . Sexually Active: Not on file   Other Topics Concern  . Not on file   Social History Narrative  . No narrative on file   Family History  Problem Relation Age of Onset  . Hypertension Father   . Lung cancer Father   No Known Allergies   Medication List    ASK your doctor about these medications       acetaminophen 325 MG tablet  Commonly known as:  TYLENOL  Take 650 mg by mouth every 6 (six) hours as needed for pain.       Objective: Vital signs in last 24 hours: Temp:  [97.9 F (36.6 C)-102.7 F (39.3 C)] 98.1 F (36.7 C) (02/11 0452) Pulse Rate:  [70-104] 70 (02/11 0452) Resp:  [18-20] 18 (02/11 0452) BP: (96-133)/(56-80) 108/56 mmHg (02/11 0452) SpO2:  [95 %-100 %] 95 % (02/11 0452) Weight:  [74.707 kg (164 lb 11.2 oz)] 74.707 kg (164 lb 11.2 oz) (02/11 0131)  Intake/Output from previous day: 02/10 0701 - 02/11 0700 In: -  Out: 350 [Urine:350] Intake/Output this  shift:    General appearance: alert and no distress Head: Normocephalic, without obvious abnormality, atraumatic Neck: no adenopathy, no carotid bruit, no JVD and supple, symmetrical, trachea midline Resp: clear to auscultation bilaterally Cardio: regular rate and rhythm GI: soft with no mass or HSM.  She has RUQ and RCVAT.  +BS. no hernias.  Extremities: extremities normal, atraumatic, no cyanosis or edema Skin: Skin color, texture, turgor normal. No rashes or lesions Lymph nodes: Cervical, supraclavicular, and axillary nodes normal. and no inguinal adenopathy.  Neurologic: Grossly normal  Lab Results:   Recent Labs  09/11/12 2230 09/12/12 0456  WBC 10.3 7.0  HGB 12.8 10.9*  HCT 37.8 32.1*  PLT 217 163   BMET  Recent Labs  09/11/12 2230 09/12/12 0456  NA 134* 139  K 3.2* 3.3*  CL 99 108  CO2 23 23  GLUCOSE 78 89  BUN 6 6  CREATININE 0.60 0.60  CALCIUM 8.8 7.9*   PT/INR No results found for this basename: LABPROT, INR,  in the last 72 hours ABG No results found for this basename: PHART, PCO2, PO2, HCO3,  in the last 72 hours  Studies/Results: Ct Abdomen Pelvis Wo Contrast  09/11/2012  *RADIOLOGY REPORT*  Clinical Data: Right flank pain radiating to the back.  History renal calculi.  Body aches and fever.  CT ABDOMEN AND PELVIS WITHOUT CONTRAST  Technique:  Multidetector CT imaging of the abdomen and pelvis was performed following the standard protocol without intravenous contrast.  Comparison: 04/22/2006  Findings: Mild dependent and right infrahilar subsegmental atelectasis noted.  The visualized portion of the liver, spleen, pancreas, and adrenal glands appear unremarkable in noncontrast CT appearance.  The gallbladder and biliary system appear unremarkable.  No pathologic retroperitoneal or porta hepatis adenopathy is identified.  Right hydronephrosis noted with hydroureter extending down to a 10 mm stone is proximal to the iliac vessel crossover.  The right ureter  distal to this point is of normal caliber.  There is a 4 mm nonobstructive right kidney lower pole calculus and a separate 2 mm nonobstructive right kidney lower pole calculus.  No left-sided calculi observed.  Urinary bladder not distended but unremarkable.  Appendix normal. No pathologic pelvic adenopathy is identified.  The uterus and adnexa appear unremarkable.  No free pelvic fluid.  IMPRESSION:  1.  Right hydronephrosis and hydroureter extending down to a 10 x 12 x 8 mm right mid ureteral calculus, just proximal to the iliac vessel crossover. 2.  Nonobstructive right kidney lower pole nephrolithiasis. 3.  Subsegmental atelectasis in the lung bases.   Original Report Authenticated By: Gaylyn Rong, M.D.     Anti-infectives: Anti-infectives   Start     Dose/Rate Route Frequency Ordered Stop   09/12/12 0145  piperacillin-tazobactam (ZOSYN) IVPB 3.375 g     3.375 g 12.5 mL/hr over 240 Minutes Intravenous 3 times per day 09/12/12 0140     09/11/12 2130  cefTRIAXone (ROCEPHIN) 1 g in dextrose 5 % 50 mL IVPB     1 g 100 mL/hr over 30 Minutes Intravenous  Once 09/11/12 2122 09/11/12 2204      Current Facility-Administered Medications  Medication Dose Route Frequency Provider Last Rate Last Dose  . 0.9 %  sodium chloride infusion   Intravenous Continuous Eduard Clos, MD 125 mL/hr at 09/12/12 0145    . acetaminophen (TYLENOL) tablet 650 mg  650 mg Oral Q6H PRN Eduard Clos, MD       Or  . acetaminophen (TYLENOL) suppository 650 mg  650 mg Rectal Q6H PRN Eduard Clos, MD      . HYDROmorphone (DILAUDID) injection 0.5 mg  0.5 mg Intravenous Q2H PRN Eduard Clos, MD   0.5 mg at 09/12/12 0527  . influenza  inactive virus vaccine (FLUZONE/FLUARIX) injection 0.5 mL  0.5 mL Intramuscular Once Sonnie Alamo, RN      . ondansetron (ZOFRAN) tablet 4 mg  4 mg Oral Q6H PRN Eduard Clos, MD       Or  . ondansetron [facility]) injection 4 mg  4 mg Intravenous Q6H PRN  Eduard Clos, MD      . piperacillin-tazobactam (ZOSYN) IVPB 3.375 g  3.375 g Intravenous Q8H Eduard Clos, MD   3.375 g at 09/12/12 0618  . pneumococcal 23 valent vaccine (PNU-IMMUNE) injection 0.5 mL  0.5 mL Intramuscular Once Lavanya L Sauve, RN      . sodium chloride 0.9 % injection 3 mL  3 mL Intravenous Q12H Eduard Clos, MD   3 mL at 09/12/12 0145    Assessment: She has a right mid ureteral stone with pain and fever although she  is now afebrile and doesn't have an elevated WBC count.  Plan: She need cystoscopy with placement of a right ureteral stent with probable ESWL vs ureteroscopy at a later date. I have reviewed the risks of bleeding, infection, ureteral injury, stent irritation, poss. Need for a perc tube, thrombotic events and anesthetic complications.   I will get her set up for later today.   CC: Dr. Benjamine Mola    LOS: 1 day    Anner Crete 09/12/2012

## 2012-09-26 NOTE — Anesthesia Postprocedure Evaluation (Signed)
  Anesthesia Post-op Note  Patient: Dominique Perry  Procedure(s) Performed: Procedure(s) (LRB): RIGHT URETEROSCOPY/STONE EXTRACTION WITH STENT (Right) HOLMIUM LASER APPLICATION (Right)  Patient Location: PACU  Anesthesia Type: General  Level of Consciousness: awake and alert   Airway and Oxygen Therapy: Patient Spontanous Breathing  Post-op Pain: mild  Post-op Assessment: Post-op Vital signs reviewed, Patient's Cardiovascular Status Stable, Respiratory Function Stable, Patent Airway and No signs of Nausea or vomiting  Last Vitals:  Filed Vitals:   09/26/12 1315  BP: 110/68  Pulse: 65  Temp: 36.7 C  Resp: 15    Post-op Vital Signs: stable   Complications: No apparent anesthesia complications

## 2012-09-26 NOTE — Anesthesia Preprocedure Evaluation (Signed)
Anesthesia Evaluation  Patient identified by MRN, date of birth, ID band Patient awake    Reviewed: Allergy & Precautions, H&P , NPO status , Patient's Chart, lab work & pertinent test results  Airway Mallampati: II TM Distance: >3 FB Neck ROM: Full    Dental  (+) Teeth Intact and Dental Advisory Given   Pulmonary Current Smoker,  breath sounds clear to auscultation  Pulmonary exam normal       Cardiovascular negative cardio ROS  Rhythm:Regular Rate:Normal     Neuro/Psych negative neurological ROS  negative psych ROS   GI/Hepatic negative GI ROS, Neg liver ROS,   Endo/Other  negative endocrine ROS  Renal/GU negative Renal ROS  negative genitourinary   Musculoskeletal negative musculoskeletal ROS (+)   Abdominal   Peds  Hematology negative hematology ROS (+)   Anesthesia Other Findings   Reproductive/Obstetrics negative OB ROS                           Anesthesia Physical  Anesthesia Plan  ASA: II  Anesthesia Plan: General   Post-op Pain Management:    Induction: Intravenous  Airway Management Planned: LMA  Additional Equipment:   Intra-op Plan:   Post-operative Plan: Extubation in OR  Informed Consent: I have reviewed the patients History and Physical, chart, labs and discussed the procedure including the risks, benefits and alternatives for the proposed anesthesia with the patient or authorized representative who has indicated his/her understanding and acceptance.   Dental advisory given  Plan Discussed with: CRNA  Anesthesia Plan Comments:         Anesthesia Quick Evaluation

## 2012-09-26 NOTE — Discharge Instructions (Signed)
Ureteral Stent A ureteral stent is a soft plastic tube with multiple holes. The stent is inserted into a ureter to help drain urine from the kidney into the bladder. The tube has a coil on each end to keep it from falling out. One end stays in the kidney. The other end stays in the bladder. A stent cannot be seen from the outside. Usually it does not keep you from going about normal routines. A ureteral stent is used to bypass a blockage in your kidney or ureter. This blockage can be caused by kidney stones, scar tissue, pregnancy, or other causes. It can also be used during treatment to remove a kidney stone or to let a ureter heal after surgery. The stent allows urine to drain from the kidney into the bladder. It is most often taken out after the blockage has been removed or the ureter has healed. If a stent is needed for a long time, it will be changed every few months. INSERTING THE STENT Your stent is put in by a urologist. This is a medical doctor trained for treating genitourinary (kidney, ureter and bladder) problems. Before your stent is put in, your caregiver may order x-rays or other imaging tests of your kidneys and ureters. The stent is inserted in a hospital or same day surgical center. You can anticipate going home the same day. PROCEDURE  A special x-ray machine called a fluoroscope is used to guide the insertion of your stent. This allows your doctor to make sure the stent is in the correct place.  First you are given anesthesia to keep you comfortable.  Then your doctor inserts a special lighted instrument called a cystoscope into your bladder. This allows your doctor to see the opening to the ureter.  A thin wire is carefully threaded into the bladder and up the ureter. The stent is inserted over the wire and the wire is then removed. HOME CARE INSTRUCTIONS   While the stent is in place, you may feel some discomfort. Certain movements may trigger pain or a feeling that you need to  urinate. Your caregiver may give you pain medication. Only take over-the-counter or prescription medicines for pain, discomfort, or fever as directed by your caregiver. Do not take aspirin as this can make bleeding worse.  You may be given medications to prevent infection or bladder spasms. Be sure to take all medications as directed.  Drink plenty of fluids.  You may have small amounts of bleeding causing your urine to be slightly red. This is nothing to be concerned about. REMOVAL OF THE STENT Your stent is left in until the blockage is resolved. This may take two weeks or longer. Before the stent is removed, you may have an x-ray make sure the ureter is open. The stent can be removed by your caregiver in the office. Medications may be given for comfort. Be sure to keep all follow-up appointments so your caregiver can check that you are healing properly. SEEK IMMEDIATE MEDICAL CARE IF:   Your urine is dark red or has blood clots.  You are incontinent (leaking urine).  You have an oral temperature above 102 F (38.9 C), chills, nausea (feeling sick to your stomach), or vomiting.  Your pain is not relieved by pain medication. Do not take aspirin as this can make bleeding worse.  The end of the stent comes out of the urethra. Document Released: 07/16/2000 Document Revised: 10/11/2011 Document Reviewed: 07/15/2008 [facility] Patient Information 2013 [place], [place].  You may  remove the stent by the attached string on Friday morning.  Call the office if you would prefer we do it in the office.

## 2012-09-26 NOTE — Brief Op Note (Signed)
09/26/2012  12:13 PM  PATIENT:  Dominique Perry  27 y.o. female  PRE-OPERATIVE DIAGNOSIS:  Right Mid Ureteral Stone  POST-OPERATIVE DIAGNOSIS:  right mid-ureteral stone  PROCEDURE:  Procedure(s): CYSTOSCOPY WITH STENT REMOVAL, RIGHT URETEROSCOPY/STONE EXTRACTION WITH STENT (Right) HOLMIUM LASER APPLICATION (Right)  SURGEON:  Surgeon(s) and Role:    * Anner Crete, MD - Primary  PHYSICIAN ASSISTANT:   ASSISTANTS: none   ANESTHESIA:   general  EBL:     BLOOD ADMINISTERED:none  DRAINS: 6x24cm right JJ stent.    LOCAL MEDICATIONS USED:  NONE  SPECIMEN:  Source of Specimen:  right ureteral stone fragments  DISPOSITION OF SPECIMEN:  to patient  COUNTS:  YES  TOURNIQUET:  * No tourniquets in log *  DICTATION: .Other Dictation: Dictation Number [id number]  PLAN OF CARE: Discharge to home after PACU  PATIENT DISPOSITION:  PACU - hemodynamically stable.   Delay start of Pharmacological VTE agent (>24hrs) due to surgical blood loss or risk of bleeding: not applicable

## 2012-09-26 NOTE — Preoperative (Signed)
Beta Blockers   Reason not to administer Beta Blockers:Not Applicable 

## 2012-09-26 NOTE — Transfer of Care (Signed)
Immediate Anesthesia Transfer of Care Note  Patient: Dominique Perry  Procedure(s) Performed: Procedure(s): RIGHT URETEROSCOPY/STONE EXTRACTION WITH STENT (Right) HOLMIUM LASER APPLICATION (Right)  Patient Location: PACU  Anesthesia Type:General  Level of Consciousness: awake, alert  and oriented  Airway & Oxygen Therapy: Patient Spontanous Breathing and Patient connected to face mask oxygen  Post-op Assessment: Report given to PACU RN and Post -op Vital signs reviewed and stable  Post vital signs: Reviewed and stable  Complications: No apparent anesthesia complications

## 2012-09-27 NOTE — Op Note (Signed)
Dominique Perry, Dominique Perry NO.:  [id number]  MEDICAL RECORD NO.:  [id number]  LOCATION:  [facility]                         FACILITY:  [facility]  PHYSICIAN:  Excell Seltzer. Annabell Howells, M.D.    DATE OF BIRTH:  1986/06/22  DATE OF PROCEDURE:  09/26/2012 DATE OF DISCHARGE:  09/26/2012                              OPERATIVE REPORT   PROCEDURE:  Cystoscopy, removal of right double-J stent, ureteroscopy with holmium laser lithotripsy and stone extraction with replacement of right double-J stent.  PREOPERATIVE DIAGNOSIS:  A 1 cm right mid ureteral stone.  POSTOPERATIVE DIAGNOSIS:  A 1 cm right mid ureteral stone.  SURGEON:  Excell Seltzer. Annabell Howells, M.D.  ANESTHESIA:  General.  SPECIMEN:  Stone fragments.  DRAINS:  A 6-French 24 cm right double-J stent.  COMPLICATIONS:  None.  INDICATIONS:  Dominique Perry is a 27 year old, white female, who was seen earlier this month with a 1-cm right mid ureteral stone with obstruction and fever.  She underwent stent placement and returns now for definitive ureteroscopic stone extraction.  FINDINGS OF PROCEDURE:  She was given Unasyn.  She was taken to the operating room where a general anesthetic was induced.  She was placed in lithotomy position and fitted with PAS hose.  Her perineum and genitalia were prepped with Betadine solution.  She was draped in the usual sterile fashion.  B and O suppository had been placed as well. She then underwent cystoscopy with a 22-French scope and 12-degree lens. Examination revealed a normal urethra.  The stent was exiting the right ureteral orifice.  There was edema around the stent.  The urine was Pyridium stained.  The bladder wall was otherwise unremarkable as was the left ureteral orifice.  The stent was grasped and pulled the urethral meatus.  A guidewire was then passed through the stent to the kidney, and the stent was removed over the wire.  The 6.4-French short ureteroscope was then inserted alongside the wire. This  was advanced to the level of stone, which was readily visualized.  The stone was then engaged with a 365 micron holmium laser fiber set on 0.5 watts and 20 hertz initially, but later increased to 0.8 watts, with a rate reduced to 10 hertz.  The stone was fragmented readily with the laser and once adequate fragmentation had been performed, a Nitinol basket was use retrieve the original residual fragments.  After all the fragments were removed, inspection of the stone area revealed some fairly significant inflammatory changes with edema and mucosal irritation, and it was felt the replacement stent was indicated for few days to allow further healing.  The guidewire was confirmed to be in the kidney.  A 6-French 24-cm double-J stent was advanced over the wire to the kidney.  The wire was removed leaving good coil in the kidney and a good coil in the bladder. The cystoscope was passed and some residual fragments in the bladder were evacuated, and the bladder was drained.  The stent string was left exiting urethra.  It was tied close to the meatus and trimmed short. The patient was taken down from lithotomy position.  Her anesthetic was reversed.  She was moved to recovery room in  stable condition.  There were no complications.  The stone fragments were given to the patient's family to bring to the office for analysis.     Excell Seltzer. Annabell Howells, M.D.     JJW/MEDQ  D:  09/26/2012  T:  09/27/2012  Job:  [id number]

## 2012-09-28 ENCOUNTER — Encounter (HOSPITAL_COMMUNITY): Payer: Self-pay | Admitting: Urology

## 2014-12-01 ENCOUNTER — Emergency Department (HOSPITAL_COMMUNITY)
Admission: EM | Admit: 2014-12-01 | Discharge: 2014-12-02 | Disposition: A | Discharge status: Home / Self Care | Payer: Self-pay | Attending: Emergency Medicine | Admitting: Emergency Medicine

## 2014-12-01 ENCOUNTER — Encounter (HOSPITAL_COMMUNITY): Payer: Self-pay | Admitting: Emergency Medicine

## 2014-12-01 DIAGNOSIS — N739 Female pelvic inflammatory disease, unspecified: Secondary | ICD-10-CM | POA: Insufficient documentation

## 2014-12-01 DIAGNOSIS — Z9851 Tubal ligation status: Secondary | ICD-10-CM | POA: Insufficient documentation

## 2014-12-01 DIAGNOSIS — Z87448 Personal history of other diseases of urinary system: Secondary | ICD-10-CM | POA: Insufficient documentation

## 2014-12-01 DIAGNOSIS — Z720 Tobacco use: Secondary | ICD-10-CM | POA: Insufficient documentation

## 2014-12-01 DIAGNOSIS — R1031 Right lower quadrant pain: Secondary | ICD-10-CM

## 2014-12-01 DIAGNOSIS — Z862 Personal history of diseases of the blood and blood-forming organs and certain disorders involving the immune mechanism: Secondary | ICD-10-CM | POA: Insufficient documentation

## 2014-12-01 DIAGNOSIS — Z9889 Other specified postprocedural states: Secondary | ICD-10-CM | POA: Insufficient documentation

## 2014-12-01 DIAGNOSIS — Z3202 Encounter for pregnancy test, result negative: Secondary | ICD-10-CM | POA: Insufficient documentation

## 2014-12-01 DIAGNOSIS — Z87442 Personal history of urinary calculi: Secondary | ICD-10-CM | POA: Insufficient documentation

## 2014-12-01 DIAGNOSIS — R5383 Other fatigue: Secondary | ICD-10-CM | POA: Insufficient documentation

## 2014-12-01 DIAGNOSIS — N730 Acute parametritis and pelvic cellulitis: Secondary | ICD-10-CM

## 2014-12-01 LAB — CBC WITH DIFFERENTIAL/PLATELET
BASOS PCT: 0 % (ref 0–1)
Basophils Absolute: 0 10*3/uL (ref 0.0–0.1)
Eosinophils Absolute: 0.1 10*3/uL (ref 0.0–0.7)
Eosinophils Relative: 1 % (ref 0–5)
HEMATOCRIT: 43.2 % (ref 36.0–46.0)
HEMOGLOBIN: 14.4 g/dL (ref 12.0–15.0)
LYMPHS ABS: 0.6 10*3/uL — AB (ref 0.7–4.0)
Lymphocytes Relative: 5 % — ABNORMAL LOW (ref 12–46)
MCH: 32.1 pg (ref 26.0–34.0)
MCHC: 33.3 g/dL (ref 30.0–36.0)
MCV: 96.2 fL (ref 78.0–100.0)
MONO ABS: 0.3 10*3/uL (ref 0.1–1.0)
MONOS PCT: 3 % (ref 3–12)
Neutro Abs: 9.9 10*3/uL — ABNORMAL HIGH (ref 1.7–7.7)
Neutrophils Relative %: 91 % — ABNORMAL HIGH (ref 43–77)
Platelets: 220 10*3/uL (ref 150–400)
RBC: 4.49 MIL/uL (ref 3.87–5.11)
RDW: 12.7 % (ref 11.5–15.5)
WBC: 10.8 10*3/uL — ABNORMAL HIGH (ref 4.0–10.5)

## 2014-12-01 LAB — COMPREHENSIVE METABOLIC PANEL
ALT: 32 U/L (ref 14–54)
AST: 27 U/L (ref 15–41)
Albumin: 4.3 g/dL (ref 3.5–5.0)
Alkaline Phosphatase: 62 U/L (ref 38–126)
Anion gap: 7 (ref 5–15)
BUN: 13 mg/dL (ref 6–20)
CALCIUM: 9 mg/dL (ref 8.9–10.3)
CO2: 21 mmol/L — ABNORMAL LOW (ref 22–32)
Chloride: 109 mmol/L (ref 101–111)
Creatinine, Ser: 0.52 mg/dL (ref 0.44–1.00)
GFR calc Af Amer: >60 mL/min (ref >60)
GFR calc non Af Amer: >60 mL/min (ref >60)
Glucose, Bld: 105 mg/dL — ABNORMAL HIGH (ref 70–99)
Potassium: 4.1 mmol/L (ref 3.5–5.1)
SODIUM: 137 mmol/L (ref 135–145)
TOTAL PROTEIN: 7.4 g/dL (ref 6.5–8.1)
Total Bilirubin: 0.7 mg/dL (ref 0.3–1.2)

## 2014-12-01 LAB — LIPASE, BLOOD: Lipase: 19 U/L — ABNORMAL LOW (ref 22–51)

## 2014-12-01 LAB — URINALYSIS, ROUTINE W REFLEX MICROSCOPIC
Bilirubin Urine: NEGATIVE
GLUCOSE, UA: NEGATIVE mg/dL
Hgb urine dipstick: NEGATIVE
Ketones, ur: NEGATIVE mg/dL
LEUKOCYTES UA: NEGATIVE
Nitrite: NEGATIVE
PROTEIN: NEGATIVE mg/dL
Specific Gravity, Urine: 1.025 (ref 1.005–1.030)
Urobilinogen, UA: 1 mg/dL (ref 0.0–1.0)
pH: 8.5 — ABNORMAL HIGH (ref 5.0–8.0)

## 2014-12-01 LAB — I-STAT BETA HCG BLOOD, ED (MC, WL, AP ONLY): I-stat hCG, quantitative: 5.4 m[IU]/mL — ABNORMAL HIGH (ref <5)

## 2014-12-01 LAB — POC URINE PREG, ED: PREG TEST UR: NEGATIVE

## 2014-12-01 MED ORDER — ONDANSETRON HCL 4 MG/2ML IJ SOLN
4.0000 mg | Freq: Once | INTRAMUSCULAR | Status: AC
  Administered 2014-12-01: 4 mg via INTRAVENOUS
  Filled 2014-12-01: qty 2

## 2014-12-01 MED ORDER — SODIUM CHLORIDE 0.9 % IV SOLN
1000.0000 mL | INTRAVENOUS | Status: DC
  Administered 2014-12-01: 1000 mL via INTRAVENOUS

## 2014-12-01 MED ORDER — HYDROMORPHONE HCL 1 MG/ML IJ SOLN
1.0000 mg | INTRAMUSCULAR | Status: DC | PRN
  Administered 2014-12-01: 1 mg via INTRAVENOUS
  Filled 2014-12-01 (×2): qty 1

## 2014-12-01 MED ORDER — SODIUM CHLORIDE 0.9 % IV SOLN
1000.0000 mL | Freq: Once | INTRAVENOUS | Status: AC
  Administered 2014-12-01: 1000 mL via INTRAVENOUS

## 2014-12-01 NOTE — ED Provider Notes (Signed)
CSN: [id number]     Arrival date & time 12/01/14  2117 History   First MD Initiated Contact with Patient 12/01/14 2151     Chief Complaint  Patient presents with  . Flank Pain  . Fatigue   HPI Pt started having trouble with headaches and body aches today.  She thought she might have been coming down with a cold.  This evening she started having pain in the right flank.  It does go across the lower back too.  Urine seems dark but no burning.  She has had some vomiting (5 episodes) and diarrhea (3-4 episodes) today.   Past Medical History  Diagnosis Date  . Ureteral stone with hydronephrosis   . Pyelonephritis   . Anemia    Past Surgical History  Procedure Laterality Date  . Tubal ligation    . Wrist surgery    . Cystoscopy with stent placement Right 09/12/2012    Procedure: CYSTOSCOPY WITH STENT PLACEMENT;  Surgeon: Valetta Fulleravid S Grapey, MD;  Location: [facility];  Service: Urology;  Laterality: Right;  . Cystoscopy/retrograde/ureteroscopy/stone extraction with basket Right 09/26/2012    Procedure: RIGHT URETEROSCOPY/STONE EXTRACTION WITH STENT;  Surgeon: Anner CreteJohn J Wrenn, MD;  Location: [facility];  Service: Urology;  Laterality: Right;  . Holmium laser application Right 09/26/2012    Procedure: HOLMIUM LASER APPLICATION;  Surgeon: Anner CreteJohn J Wrenn, MD;  Location: [facility];  Service: Urology;  Laterality: Right;   Family History  Problem Relation Age of Onset  . Hypertension Father   . Lung cancer Father    History  Substance Use Topics  . Smoking status: Current Every Day Smoker -- 0.25 packs/day for 11 years    Types: Cigarettes  . Smokeless tobacco: Never Used  . Alcohol Use: Yes     Comment: OCCAS   OB History    No data available     Review of Systems  Constitutional: Negative for fever.  Respiratory: Negative for cough.   Cardiovascular: Negative for chest pain.  Gastrointestinal: Positive for abdominal pain (right side lower).  Genitourinary: Negative for vaginal bleeding and vaginal  discharge.      Allergies  Vicodin  Home Medications   Prior to Admission medications   Medication Sig Start Date End Date Taking? Authorizing Provider  acetaminophen (TYLENOL) 325 MG tablet Take 650 mg by mouth every 6 (six) hours as needed for pain.    Historical Provider, MD  cephALEXin (KEFLEX) 500 MG capsule Take 500 mg by mouth 3 (three) times daily. 09-18-12 for 10 days 09/18/12   Reuben Likesavid C Keller, MD  oxyCODONE-acetaminophen (PERCOCET) 5-325 MG per tablet 1 to 2 tablets every 6 hours as needed for pain. Patient not taking: Reported on 12/01/2014 09/26/12   Bjorn PippinJohn Wrenn, MD  phenazopyridine (PYRIDIUM) 200 MG tablet Take 1 tablet (200 mg total) by mouth 3 (three) times daily as needed for pain. Patient not taking: Reported on 12/01/2014 09/14/12   Renae FickleMackenzie Short, MD   BP 113/71 mmHg  Pulse 89  Temp(Src) 98.8 F (37.1 C) (Oral)  Resp 16  SpO2 97%  LMP 11/17/2014 Physical Exam  Constitutional: She appears well-developed and well-nourished. She appears distressed.  HENT:  Head: Normocephalic and atraumatic.  Right Ear: External ear normal.  Left Ear: External ear normal.  Eyes: Conjunctivae are normal. Right eye exhibits no discharge. Left eye exhibits no discharge. No scleral icterus.  Neck: Neck supple. No tracheal deviation present.  Cardiovascular: Normal rate, regular rhythm and intact distal pulses.   Pulmonary/Chest: Effort  normal and breath sounds normal. No stridor. No respiratory distress. She has no wheezes. She has no rales.  Abdominal: Soft. Bowel sounds are normal. She exhibits no distension. There is tenderness in the right lower quadrant. There is guarding and CVA tenderness (rigth). There is no rigidity and no rebound.  Genitourinary: Uterus is not tender. Cervix exhibits discharge. Cervix exhibits no motion tenderness and no friability. Right adnexum displays tenderness. Right adnexum displays no mass and no fullness. Left adnexum displays no mass and no fullness. No  foreign body around the vagina. No signs of injury around the vagina. Vaginal discharge found.  Musculoskeletal: She exhibits no edema or tenderness.  Neurological: She is alert. She has normal strength. No cranial nerve deficit (no facial droop, extraocular movements intact, no slurred speech) or sensory deficit. She exhibits normal muscle tone. She displays no seizure activity. Coordination normal.  Skin: Skin is warm and dry. No rash noted.  Psychiatric: She has a normal mood and affect.  Nursing note and vitals reviewed.   ED Course  Procedures (including critical care time) Labs Review Labs Reviewed  LIPASE, BLOOD - Abnormal; Notable for the following:    Lipase 19 (*)    All other components within normal limits  COMPREHENSIVE METABOLIC PANEL - Abnormal; Notable for the following:    CO2 21 (*)    Glucose, Bld 105 (*)    All other components within normal limits  CBC WITH DIFFERENTIAL/PLATELET - Abnormal; Notable for the following:    WBC 10.8 (*)    Neutrophils Relative % 91 (*)    Neutro Abs 9.9 (*)    Lymphocytes Relative 5 (*)    Lymphs Abs 0.6 (*)    All other components within normal limits  URINALYSIS, ROUTINE W REFLEX MICROSCOPIC - Abnormal; Notable for the following:    pH 8.5 (*)    All other components within normal limits  I-STAT BETA HCG BLOOD, ED ([facility], [facility], [facility] ONLY) - Abnormal; Notable for the following:    I-stat hCG, quantitative 5.4 (*)    All other components within normal limits  WET PREP, GENITAL  RPR  HIV ANTIBODY (ROUTINE TESTING)  POC URINE PREG, ED  GC/CHLAMYDIA PROBE AMP ([place])    Medications  0.9 %  sodium chloride infusion (1,000 mLs Intravenous New Bag/Given 12/01/14 2237)    Followed by  0.9 %  sodium chloride infusion (1,000 mLs Intravenous New Bag/Given 12/01/14 2238)  HYDROmorphone (DILAUDID) injection 1 mg (1 mg Intravenous Given 12/01/14 2238)  ondansetron (ZOFRAN) injection 4 mg (4 mg Intravenous Given 12/01/14 2136)  ondansetron  (ZOFRAN) injection 4 mg (4 mg Intravenous Given 12/01/14 2238)     MDM   Pt has persistent pain in the RLQ.  ?ruptured ovarian cyst, appendicitis.  Will ct to evaluate further.  Will turn case over to Dr Norlene Campbell.   Linwood Dibbles, MD 12/02/14 0030

## 2014-12-01 NOTE — ED Notes (Addendum)
Pt from home c/o right flank pain and emesis since today.  She is tearful in triage. She reports dark urine but denies any other urinary symptoms. Pt reports hx of stones with stent placement but this pain is different .

## 2014-12-01 NOTE — ED Notes (Signed)
Bed: WA09 Expected date:  Expected time:  Means of arrival:  Comments: Hold for t1

## 2014-12-02 ENCOUNTER — Emergency Department (HOSPITAL_COMMUNITY): Payer: Self-pay

## 2014-12-02 LAB — WET PREP, GENITAL
Clue Cells Wet Prep HPF POC: NONE SEEN
TRICH WET PREP: NONE SEEN
Yeast Wet Prep HPF POC: NONE SEEN

## 2014-12-02 LAB — RPR: RPR Ser Ql: NONREACTIVE

## 2014-12-02 LAB — GC/CHLAMYDIA PROBE AMP (~~LOC~~) NOT AT ARMC
CHLAMYDIA, DNA PROBE: NEGATIVE
NEISSERIA GONORRHEA: NEGATIVE

## 2014-12-02 LAB — HIV ANTIBODY (ROUTINE TESTING W REFLEX): HIV Screen 4th Generation wRfx: NONREACTIVE

## 2014-12-02 MED ORDER — PROMETHAZINE HCL 25 MG/ML IJ SOLN
12.5000 mg | Freq: Once | INTRAMUSCULAR | Status: AC
  Administered 2014-12-02: 12.5 mg via INTRAVENOUS
  Filled 2014-12-02: qty 1

## 2014-12-02 MED ORDER — OXYCODONE-ACETAMINOPHEN 5-325 MG PO TABS: ORAL_TABLET | ORAL | Status: DC

## 2014-12-02 MED ORDER — ONDANSETRON 8 MG PO TBDP: 8.0000 mg | ORAL_TABLET | Freq: Three times a day (TID) | ORAL | Status: DC | PRN

## 2014-12-02 MED ORDER — AZITHROMYCIN 250 MG PO TABS
1000.0000 mg | ORAL_TABLET | Freq: Once | ORAL | Status: AC
  Administered 2014-12-02: 1000 mg via ORAL
  Filled 2014-12-02: qty 4

## 2014-12-02 MED ORDER — DOXYCYCLINE HYCLATE 100 MG PO CAPS: 100.0000 mg | ORAL_CAPSULE | Freq: Two times a day (BID) | ORAL | Status: DC

## 2014-12-02 MED ORDER — PROMETHAZINE HCL 25 MG PO TABS: 25.0000 mg | ORAL_TABLET | Freq: Four times a day (QID) | ORAL | Status: DC | PRN

## 2014-12-02 MED ORDER — DEXTROSE 5 % IV SOLN
250.0000 mg | Freq: Once | INTRAVENOUS | Status: AC
  Administered 2014-12-02: 250 mg via INTRAVENOUS
  Filled 2014-12-02: qty 250

## 2014-12-02 MED ORDER — IOHEXOL 300 MG/ML  SOLN
100.0000 mL | Freq: Once | INTRAMUSCULAR | Status: AC | PRN
  Administered 2014-12-02: 100 mL via INTRAVENOUS

## 2014-12-02 MED ORDER — IOHEXOL 300 MG/ML  SOLN
50.0000 mL | Freq: Once | INTRAMUSCULAR | Status: AC | PRN
  Administered 2014-12-02: 50 mL via ORAL

## 2014-12-02 NOTE — ED Notes (Signed)
Pt alert, oriented, and ambulatory upon. DC. She was advised to follow up with women's clinic.

## 2014-12-02 NOTE — Discharge Instructions (Signed)
Take medications as prescribed.  Follow-up with your gynecologist and/or the health department.  Return to the emergency department for worsening condition or new concerning symptoms.    Pelvic Inflammatory Disease Pelvic inflammatory disease (PID) refers to an infection in some or all of the female organs. The infection can be in the uterus, ovaries, fallopian tubes, or the surrounding tissues in the pelvis. PID can cause abdominal or pelvic pain that comes on suddenly (acute pelvic pain). PID is a serious infection because it can lead to lasting (chronic) pelvic pain or the inability to have children (infertile).  CAUSES  The infection is often caused by the normal bacteria found in the vaginal tissues. PID may also be caused by an infection that is spread during sexual contact. PID can also occur following:   The birth of a baby.   A miscarriage.   An abortion.   Major pelvic surgery.   The use of an intrauterine device (IUD).   A sexual assault.  RISK FACTORS Certain factors can put a person at higher risk for PID, such as:  Being younger than 25 years.  Being sexually active at [place] age.  Usingnonbarrier contraception.  Havingmultiple sexual partners.  Having sex with someone who has symptoms of a genital infection.  Using oral contraception. Other times, certain behaviors can increase the possibility of getting PID, such as:  Having sex during your period.  Using a vaginal douche.  Having an intrauterine device (IUD) in place. SYMPTOMS   Abdominal or pelvic pain.   Fever.   Chills.   Abnormal vaginal discharge.  Abnormal uterine bleeding.   Unusual pain shortly after finishing your period. DIAGNOSIS  Your caregiver will choose some of the following methods to make a diagnosis, such as:   Performinga physical exam and history. A pelvic exam typically reveals a very tender uterus and surrounding pelvis.   Ordering laboratory tests  including a pregnancy test, blood tests, and urine test.  Orderingcultures of the vagina and cervix to check for a sexually transmitted infection (STI).  Performing an ultrasound.   Performing a laparoscopic procedure to look inside the pelvis.  TREATMENT   Antibiotic medicines may be prescribed and taken by mouth.   Sexual partners may be treated when the infection is caused by a sexually transmitted disease (STD).   Hospitalization may be needed to give antibiotics intravenously.  Surgery may be needed, but this is rare. It may take weeks until you are completely well. If you are diagnosed with PID, you should also be checked for human immunodeficiency virus (HIV). HOME CARE INSTRUCTIONS   If given, take your antibiotics as directed. Finish the medicine even if you start to feel better.   Only take over-the-counter or prescription medicines for pain, discomfort, or fever as directed by your caregiver.   Do not have sexual intercourse until treatment is completed or as directed by your caregiver. If PID is confirmed, your recent sexual partner(s) will need treatment.   Keep your follow-up appointments. SEEK MEDICAL CARE IF:   You have increased or abnormal vaginal discharge.   You need prescription medicine for your pain.   You vomit.   You cannot take your medicines.   Your partner has an STD.  SEEK IMMEDIATE MEDICAL CARE IF:   You have a fever.   You have increased abdominal or pelvic pain.   You have chills.   You have pain when you urinate.   You are not better after 72 hours following treatment.  MAKE SURE YOU:   Understand these instructions.  Will watch your condition.  Will get help right away if you are not doing well or get worse. Document Released: 07/19/2005 Document Revised: 11/13/2012 Document Reviewed: 07/15/2011 [facility] Patient Information 2015 [place], [place]. This information is not intended to replace advice given to  you by your health care provider. Make sure you discuss any questions you have with your health care provider.  Sexually Transmitted Disease A sexually transmitted disease (STD) is a disease or infection often passed to another person during sex. However, STDs can be passed through nonsexual ways. An STD can be passed through:  Spit (saliva).  Semen.  Blood.  Mucus from the vagina.  Pee (urine). HOW CAN I LESSEN MY CHANCES OF GETTING AN STD?  Use:  Latex condoms.  Water-soluble lubricants with condoms. Do not use petroleum jelly or oils.  Dental dams. These are small pieces of latex that are used as a barrier during oral sex.  Avoid having more than one sex partner.  Do not have sex with someone who has other sex partners.  Do not have sex with anyone you do not know or who is at high risk for an STD.  Avoid risky sex that can break your skin.  Do not have sex if you have open sores on your mouth or skin.  Avoid drinking too much alcohol or taking illegal drugs. Alcohol and drugs can affect your good judgment.  Avoid oral and anal sex acts.  Get shots (vaccines) for HPV and hepatitis.  If you are at risk of being infected with HIV, it is advised that you take a certain medicine daily to prevent HIV infection. This is called pre-exposure prophylaxis (PrEP). You may be at risk if:  You are a man who has sex with other men (MSM).  You are attracted to the opposite sex (heterosexual) and are having sex with more than one partner.  You take drugs with a needle.  You have sex with someone who has HIV.  Talk with your doctor about if you are at high risk of being infected with HIV. If you begin to take PrEP, get tested for HIV first. Get tested every 3 months for as long as you are taking PrEP. WHAT SHOULD I DO IF I THINK I HAVE AN STD?  See your doctor.  Tell your sex partner(s) that you have an STD. They should be tested and treated.  Do not have sex until your  doctor says it is okay. WHEN SHOULD I GET HELP? Get help right away if:  You have bad belly (abdominal) pain.  You are a man and have puffiness (swelling) or pain in your testicles.  You are a woman and have puffiness in your vagina. Document Released: 08/26/2004 Document Revised: 07/24/2013 Document Reviewed: 01/12/2013 [facility] Patient Information 2015 [place], [place]. This information is not intended to replace advice given to you by your health care provider. Make sure you discuss any questions you have with your health care provider.

## 2014-12-02 NOTE — ED Provider Notes (Signed)
Care assumed from Dr. Lynelle Doctor.  Patient with right flank and right lower abdominal pain.  Per his report, she was tender in the right adnexa.  Patient had CT scan pending.  CT scan is unremarkable.  Wet prep with to numerous to count white blood cells.  Concern for PID.  Patient reports that she has a new sexual partner.  Will treat with Rocephin and Zithromax here, patient home on doxycycline.  She will be given outpatient referrals.   Results for orders placed or performed during the hospital encounter of 12/01/14  Wet prep, genital  Result Value Ref Range   Yeast Wet Prep HPF POC NONE SEEN NONE SEEN   Trich, Wet Prep NONE SEEN NONE SEEN   Clue Cells Wet Prep HPF POC NONE SEEN NONE SEEN   WBC, Wet Prep HPF POC TOO NUMEROUS TO COUNT (A) NONE SEEN  Lipase, blood  Result Value Ref Range   Lipase 19 (L) 22 - 51 U/L  Comprehensive metabolic panel  Result Value Ref Range   Sodium 137 135 - 145 mmol/L   Potassium 4.1 3.5 - 5.1 mmol/L   Chloride 109 101 - 111 mmol/L   CO2 21 (L) 22 - 32 mmol/L   Glucose, Bld 105 (H) 70 - 99 mg/dL   BUN 13 6 - 20 mg/dL   Creatinine, Ser 1.19 0.44 - 1.00 mg/dL   Calcium 9.0 8.9 - 14.7 mg/dL   Total Protein 7.4 6.5 - 8.1 g/dL   Albumin 4.3 3.5 - 5.0 g/dL   AST 27 15 - 41 U/L   ALT 32 14 - 54 U/L   Alkaline Phosphatase 62 38 - 126 U/L   Total Bilirubin 0.7 0.3 - 1.2 mg/dL   GFR calc non Af Amer >60 >60 mL/min   GFR calc Af Amer >60 >60 mL/min   Anion gap 7 5 - 15  CBC with Differential  Result Value Ref Range   WBC 10.8 (H) 4.0 - 10.5 K/uL   RBC 4.49 3.87 - 5.11 MIL/uL   Hemoglobin 14.4 12.0 - 15.0 g/dL   HCT 82.9 56.2 - 13.0 %   MCV 96.2 78.0 - 100.0 fL   MCH 32.1 26.0 - 34.0 pg   MCHC 33.3 30.0 - 36.0 g/dL   RDW 86.5 78.4 - 69.6 %   Platelets 220 150 - 400 K/uL   Neutrophils Relative % 91 (H) 43 - 77 %   Neutro Abs 9.9 (H) 1.7 - 7.7 K/uL   Lymphocytes Relative 5 (L) 12 - 46 %   Lymphs Abs 0.6 (L) 0.7 - 4.0 K/uL   Monocytes Relative 3 3 - 12 %   Monocytes Absolute 0.3 0.1 - 1.0 K/uL   Eosinophils Relative 1 0 - 5 %   Eosinophils Absolute 0.1 0.0 - 0.7 K/uL   Basophils Relative 0 0 - 1 %   Basophils Absolute 0.0 0.0 - 0.1 K/uL  Urinalysis, Routine w reflex microscopic  Result Value Ref Range   Color, Urine YELLOW YELLOW   APPearance CLEAR CLEAR   Specific Gravity, Urine 1.025 1.005 - 1.030   pH 8.5 (H) 5.0 - 8.0   Glucose, UA NEGATIVE NEGATIVE mg/dL   Hgb urine dipstick NEGATIVE NEGATIVE   Bilirubin Urine NEGATIVE NEGATIVE   Ketones, ur NEGATIVE NEGATIVE mg/dL   Protein, ur NEGATIVE NEGATIVE mg/dL   Urobilinogen, UA 1.0 0.0 - 1.0 mg/dL   Nitrite NEGATIVE NEGATIVE   Leukocytes, UA NEGATIVE NEGATIVE  POC Urine Pregnancy, ED  (If Pre-menopausal  female)  not [facility]  Result Value Ref Range   Preg Test, Ur NEGATIVE NEGATIVE  I-Stat Beta hCG blood, ED ([facility], [facility], [facility] only)  Result Value Ref Range   I-stat hCG, quantitative 5.4 (H) <5 mIU/mL   Comment 3           Ct Abdomen Pelvis W Contrast  12/02/2014   CLINICAL DATA:  Headache and body aches.  EXAM: CT ABDOMEN AND PELVIS WITH CONTRAST  TECHNIQUE: Multidetector CT imaging of the abdomen and pelvis was performed using the standard protocol following bolus administration of intravenous contrast.  CONTRAST:  50mL OMNIPAQUE IOHEXOL 300 MG/ML SOLN, 100mL OMNIPAQUE IOHEXOL 300 MG/ML SOLN  COMPARISON:  CT 09/11/2012  FINDINGS: Lower chest: Lung bases are clear.  Hepatobiliary: Small 5 mm hypodense lesion in the left hepatic lobe cannot fully characterize. No biliary duct dilatation. Gallbladder is normal.  Pancreas: Pancreas is normal. No ductal dilatation. No pancreatic inflammation.  Spleen: Normal spleen  Adrenals/urinary tract: There 2 nonobstructing calculus discretion at 2 nonobstructing calculi within the mid right kidney. No ureterolithiasis or obstructive uropathy.  Stomach/Bowel: Stomach, small bowel, appendix, and cecum are normal. The colon and rectosigmoid colon are normal.   Vascular/Lymphatic: Abdominal aorta is normal caliber. There is no retroperitoneal or periportal lymphadenopathy. No pelvic lymphadenopathy.  Reproductive: Uterus and ovaries are normal.  Musculoskeletal: No aggressive osseous lesion.  Other: No free fluid.  IMPRESSION: 1. Nonobstructing right renal calculi. 2. Normal appendix. 3. No inflammation or abscess in the abdomen pelvis.   Electronically Signed   By: Genevive BiStewart  Edmunds M.D.   On: 12/02/2014 02:08      Dominique Severinlga Surena Welge, MD 12/02/14 [phone]

## 2015-05-17 ENCOUNTER — Emergency Department (HOSPITAL_COMMUNITY): Payer: Self-pay

## 2015-05-17 ENCOUNTER — Emergency Department (HOSPITAL_COMMUNITY)
Admission: EM | Admit: 2015-05-17 | Discharge: 2015-05-17 | Disposition: A | Discharge status: Home / Self Care | Payer: Self-pay | Attending: Emergency Medicine | Admitting: Emergency Medicine

## 2015-05-17 ENCOUNTER — Encounter (HOSPITAL_COMMUNITY): Payer: Self-pay

## 2015-05-17 DIAGNOSIS — Z792 Long term (current) use of antibiotics: Secondary | ICD-10-CM | POA: Insufficient documentation

## 2015-05-17 DIAGNOSIS — Z87448 Personal history of other diseases of urinary system: Secondary | ICD-10-CM | POA: Insufficient documentation

## 2015-05-17 DIAGNOSIS — F419 Anxiety disorder, unspecified: Secondary | ICD-10-CM | POA: Insufficient documentation

## 2015-05-17 DIAGNOSIS — Z720 Tobacco use: Secondary | ICD-10-CM | POA: Insufficient documentation

## 2015-05-17 DIAGNOSIS — R079 Chest pain, unspecified: Secondary | ICD-10-CM | POA: Insufficient documentation

## 2015-05-17 DIAGNOSIS — R202 Paresthesia of skin: Secondary | ICD-10-CM | POA: Insufficient documentation

## 2015-05-17 DIAGNOSIS — R0602 Shortness of breath: Secondary | ICD-10-CM | POA: Insufficient documentation

## 2015-05-17 DIAGNOSIS — Z3202 Encounter for pregnancy test, result negative: Secondary | ICD-10-CM | POA: Insufficient documentation

## 2015-05-17 LAB — BASIC METABOLIC PANEL
ANION GAP: 17 — AB (ref 5–15)
CALCIUM: 9.7 mg/dL (ref 8.9–10.3)
CO2: 17 mmol/L — AB (ref 22–32)
CREATININE: 0.71 mg/dL (ref 0.44–1.00)
Chloride: 104 mmol/L (ref 101–111)
GFR calc Af Amer: >60 mL/min (ref >60)
Glucose, Bld: 86 mg/dL (ref 65–99)
Potassium: 3.4 mmol/L — ABNORMAL LOW (ref 3.5–5.1)
Sodium: 138 mmol/L (ref 135–145)

## 2015-05-17 LAB — CBC
HCT: 43.4 % (ref 36.0–46.0)
HEMOGLOBIN: 14.6 g/dL (ref 12.0–15.0)
MCH: 30.4 pg (ref 26.0–34.0)
MCHC: 33.6 g/dL (ref 30.0–36.0)
MCV: 90.2 fL (ref 78.0–100.0)
PLATELETS: 367 10*3/uL (ref 150–400)
RBC: 4.81 MIL/uL (ref 3.87–5.11)
RDW: 12.4 % (ref 11.5–15.5)
WBC: 10.8 10*3/uL — ABNORMAL HIGH (ref 4.0–10.5)

## 2015-05-17 LAB — POC URINE PREG, ED: Preg Test, Ur: NEGATIVE

## 2015-05-17 LAB — URINALYSIS, ROUTINE W REFLEX MICROSCOPIC
BILIRUBIN URINE: NEGATIVE
Glucose, UA: NEGATIVE mg/dL
HGB URINE DIPSTICK: NEGATIVE
Ketones, ur: 40 mg/dL — AB
Nitrite: NEGATIVE
PH: 5.5 (ref 5.0–8.0)
Protein, ur: NEGATIVE mg/dL
SPECIFIC GRAVITY, URINE: 1.013 (ref 1.005–1.030)
UROBILINOGEN UA: 0.2 mg/dL (ref 0.0–1.0)

## 2015-05-17 LAB — I-STAT TROPONIN, ED: Troponin i, poc: 0 ng/mL (ref 0.00–0.08)

## 2015-05-17 LAB — URINE MICROSCOPIC-ADD ON

## 2015-05-17 MED ORDER — POTASSIUM CHLORIDE CRYS ER 20 MEQ PO TBCR
40.0000 meq | EXTENDED_RELEASE_TABLET | Freq: Once | ORAL | Status: AC
  Administered 2015-05-17: 40 meq via ORAL
  Filled 2015-05-17: qty 2
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
## 2015-05-17 NOTE — Discharge Instructions (Signed)
- Use the resource guide to find a primary care clinic. Call to schedule a follow up appointment for early next week.   Emergency Department Resource Guide 1) Find a Doctor and Pay Out of Pocket Although you won't have to find out who is covered by your insurance plan, it is a good idea to ask around and get recommendations. You will then need to call the office and see if the doctor you have chosen will accept you as a new patient and what types of options they offer for patients who are self-pay. Some doctors offer discounts or will set up payment plans for their patients who do not have insurance, but you will need to ask so you aren't surprised when you get to your appointment.  2) Contact Your Local Health Department Not all health departments have doctors that can see patients for sick visits, but many do, so it is worth a call to see if yours does. If you don't know where your local health department is, you can check in your phone book. The CDC also has a tool to help you locate your state's health department, and many state websites also have listings of all of their local health departments.  3) Find a Walk-in Clinic If your illness is not likely to be very severe or complicated, you may want to try a walk in clinic. These are popping up all over the country in pharmacies, drugstores, and shopping centers. They're usually staffed by nurse practitioners or physician assistants that have been trained to treat common illnesses and complaints. They're usually fairly quick and inexpensive. However, if you have serious medical issues or chronic medical problems, these are probably not your best option.  No Primary Care Doctor: - Call Health Connect at  [phone] - they can help you locate a primary care doctor that  accepts your insurance, provides certain services, etc. - Physician Referral Service- [phone]  Chronic Pain Problems: Organization         Address  Phone   Notes  [facility]  [phone] Patients need to be referred by their primary care doctor.   Medication Assistance: Organization         Address  Phone   Notes  [facility] [address] [place] [place], [place] [address] [phone] --Must be a resident of [facility] -- Must have NO insurance coverage whatsoever (no Medicaid/ Medicare, etc.) -- The pt. MUST have a primary care doctor that directs their care regularly and follows them in the community   [facility]  [phone]   [employer]  [phone]    Agencies that provide inexpensive medical care: Organization         Address  Phone   Notes  [facility]  [phone]   [facility]    [phone]   [facility] [address] [place], [place] [address] [phone]   [facility] [address] [place], [place] [phone]   Planned Parenthood    [phone]   [facility]    [phone]   [facility]  [address], [place] Phone:  [phone], Fax:  [phone] Hours of Operation:  9 am - 6 pm, M-F.  Also accepts Medicaid/Medicare and self-pay.  [facility]  [address], [place] Phone: [phone],  Fax: [phone]. Hours of Operation:  8:30 am - 5:30 pm, M-F.  Also accepts Medicaid and self-pay.  [facility] [place] [address], [place] Phone: [phone]   [facility] [address] Natasha Bence [place], [place] [phone], Ext. [phone] Mondays & Thursdays: 7-9 AM.  First 15 patients are seen on a first come, first serve basis.    Medicaid-accepting [facility] Providers:  Organization         Address  Phone   Notes  [facility] [address], [place] [phone] Also accepts self-pay patients.  [facility] [address]  Laurell Josephs [place], [place]  [phone]   [facility] [address], [place] [phone]   [facility] [address], [place] [phone]   Renaye Rakers [address], [place]   [phone] Only accepts [place] Access [place] patients after they have their name applied to their card.   Self-Pay (no insurance) in [facility]:  Organization         Address  Phone   Notes  Sickle Cell Patients, [facility] Internal Medicine [address] [place], [place] [phone]   [facility] [address] [place], [place] [phone]   [facility] [place]  [address], [place] [phone]   [facility]/Dr. Osei-Bonsu  [address], [place] or [address], [place] [phone] Phone number for both [place] and [place] locations is the same.  [facility] [address] [place] [phone]   [facility] [address], [place] or [address] [phone]   [facility] [address], [place] [phone], phone; [phone], fax Sees patients 1st and 3rd Saturday of every month.  Must not qualify for public or private insurance (i.e. Medicaid, Medicare, [place] Health Choice, Veterans' Benefits)  Household income should be no more than 200% of the poverty level The clinic cannot treat you if you are pregnant or think you are pregnant  Sexually transmitted diseases are not treated at the clinic.    Dental Care: Organization         Address  Phone  Notes  [facility] Department of [facility] [address] [place], [place] [phone] Accepts children up to age 73 who are enrolled in [place] or [place] Health Choice; pregnant women with a Medicaid card; and children who have applied for Medicaid  or [place] Health Choice, but were declined, whose parents can pay a reduced fee at time of service.  [facility] Department of [facility]  [address], [place] [phone] Accepts children up to age 79 who are enrolled in [place] or [place] Health Choice; pregnant women with a Medicaid card; and children who have applied for Medicaid or [place] Health Choice, but were declined, whose parents can pay a reduced fee at time of service.  [facility]  [address] [place], [place] [phone] Patients are seen by appointment only. Walk-ins are not accepted. [facility] will see patients 42 years of age and older. Monday - Tuesday (8am-5pm) Most Wednesdays (8:30-5pm) $30 per visit, cash only  [facility] [employer] [facility]  [address], [employer] (  [phone]) [phone] Patients are seen by appointment only. Walk-ins are not accepted. [facility] will see patients 30 years of age and older. One Wednesday Evening (Monthly: Volunteer Based).  $30 per visit, cash only  [employer] [facility]  [phone]) [phone] for adults; Children under age 864, call Graduate Pediatric Dentistry at [phone]) [phone]. Children aged 314-14, please call [phone] to request a pediatric application.  Dental services are provided in all areas of dental care including fillings, crowns and bridges, complete and partial dentures, implants, gum treatment, root canals, and extractions. Preventive care is also provided. Treatment is provided to both adults and children. Patients are selected via a lottery and there is often a waiting list.   [facility] [address], [place]  [phone] [email or web address]   [facility] [address], [place], [place] [phone], Ext. [phone] Second and Fourth Thursday of each month, opens at 6:30 AM; Clinic ends at 9 AM.  Patients are seen on a first-come first-served basis, and a limited number are seen  during each clinic.   [facility]  [address], [place], [place] [phone]) [phone]   Eligibility Requirements You must have lived in [place], [place], or [place] counties for at least the last three months.   You cannot be eligible for state or federal sponsored [place] Cityhealthcare insurance, including CIGNAVeterans Administration, [place]icaid, or [employer]icare.   You generally cannot be eligible for healthcare insurance through your employer.    How to apply: Eligibility screenings are held every Tuesday and Wednesday afternoon from 1:00 pm until 4:00 pm. You do not need an appointment for the interview!  [facility] [address], [place], [place] [phone]   [facility]  [phone]   [facility]  [phone]   [facility]  [phone]    Behavioral Health Resources in the Community: Intensive Outpatient Programs Organization         Address  Phone  Notes  [facility] [address], [place], [place] [phone]   [facility] [address], [place], [place] [phone]   [facility] [address], [place], [place]  [phone]   [facility] [address],  [place], [place] [phone] or [phone]   Substance Abuse Resources Organization         Address  Phone  Notes  [facility]  [phone]   [facility]  [phone]   [facility]  [phone]   [facility]  [phone]   Residential & Outpatient Substance Abuse Program  [phone]   Psychological Services Organization         Address  Phone  Notes  [facility]  [phone]   [facility]  [phone]   [facility] [address], [place] [phone] or [phone]    Mobile Crisis Teams Organization         Address  Phone  Notes  [facility],  Mobile Crisis Care Unit  [phone]   [facility]  [address] [place], [place] [phone]   Doristine LocksSharon DeEsch [address] [place] [phone]    Self-Help/Support Groups Organization         Address  Phone             Notes  [facility] [place] - variety of support groups  336- [phone] Call for more information  Narcotics Anonymous (NA), [facility] [address], [employer] [place]  2 meetings at this location   Residential Sports administrator         Address  Phone  Notes  [facility] [address],    [place]  [phone]   [facility]  [address] [place] [address], [place], [place] [phone]   [facility] [address] [place], [place] [phone] Admissions: 8am-3pm M-F  [facility] [address],    [place], [place] [phone]   [facility] [address] [place], [place], [place] [phone]   [facility] [address]  [place], [place] [phone]   [facility] - Intensive Outpatient [address] Laurell Josephs [address], [place], [place] [phone]   [facility] ([facility] [address] [place].,  [place], [place] [phone] or [phone]   [facility] ([facility]) [address], [place], [place] [phone] Accepts Medicaid  [facility] [address],  [place] [phone] Substance Abuse/Addiction Treatment   [facility] Organization         Address  Phone  Notes  [facility]  [phone]   Angie Fava, PhD [address] Ervin Knack [place], [place]   [phone] or [phone]   [facility]   [address] [place], [place] [phone]   [facility] [address], [place], [place] [phone] Insurance/Medicaid/sponsorship through [facility] [address]                                    [place], [place] [phone] Therapy/tele-psych/case  [facility] [address], [place] [phone]    Dr. Lolly Mustache  [phone]   [facility]  [employer] [facility] Dept. 1) [address] [place] 2) [address], [place] 3)  [address], [place] [phone]  [phone]   [facility] Child Abuse Hotline [phone] or [phone] (After Hours)       Nonspecific Chest Pain  Chest pain can be caused by many different conditions. There is always a chance that your pain could be related to something serious, such as a heart attack or a blood clot in your lungs. Chest pain can also be caused by conditions that are not life-threatening. If you have chest pain, it is very important to follow up with your health care provider. CAUSES  Chest pain can be caused by:  Heartburn.  Pneumonia or bronchitis.  Anxiety or stress.  Inflammation around your heart (pericarditis) or lung (pleuritis or pleurisy).  A blood clot in your lung.  A collapsed lung (pneumothorax). It can develop suddenly on its own (spontaneous pneumothorax) or from trauma to the chest.  Shingles infection (varicella-zoster virus).  Heart attack.  Damage to the bones, muscles, and cartilage that make up your chest wall. This can include:  Bruised bones due to injury.  Strained muscles or cartilage due to frequent or repeated coughing or overwork.  Fracture to one or more ribs.  Sore cartilage due to inflammation (costochondritis). RISK FACTORS  Risk factors for chest pain may include:  Activities that increase your risk for trauma or injury to your chest.  Respiratory infections or conditions that cause frequent coughing.  Medical conditions or overeating that can cause heartburn.  Heart disease or family history of heart disease.  Conditions or health behaviors that  increase your risk of developing a blood clot.  Having had chicken pox (varicella zoster). SIGNS AND SYMPTOMS Chest pain can feel like:  Burning or tingling on the surface of your chest or deep in your chest.  Crushing, pressure, aching, or squeezing pain.  Dull or sharp pain that is worse when you move, cough, or take a deep breath.  Pain that is also felt in your back, neck, shoulder, or arm, or pain that spreads to any of these areas. Your chest pain may come and go, or it may stay constant. DIAGNOSIS Lab tests or other studies may be needed to find the cause of your pain. Your health care provider may have you take a test called an ambulatory ECG (electrocardiogram). An ECG records your heartbeat patterns at the time the test is performed. You may also have other tests, such as:  Transthoracic echocardiogram (TTE). During echocardiography, sound waves are used to create a picture of all of the heart structures and to look at how blood flows through your heart.  Transesophageal echocardiogram (TEE).This is a more advanced imaging test that obtains images from inside your body. It allows your health care provider to see your heart in finer detail.  Cardiac monitoring. This allows your health care provider to monitor your heart rate and rhythm in real time.  Holter monitor. This is a portable device that records your heartbeat and can help to diagnose abnormal heartbeats. It allows your health care provider to track your heart activity for several days, if needed.  Stress tests. These can be done through exercise or by taking medicine that makes your heart beat more quickly.  Blood tests.  Imaging tests. TREATMENT  Your treatment depends on what is causing your chest pain. Treatment may include:  Medicines. These may include:  Acid blockers for heartburn.  Anti-inflammatory medicine.  Pain medicine for inflammatory conditions.  Antibiotic medicine, if an infection is  present.  Medicines to dissolve blood clots.  Medicines to treat coronary artery disease.  Supportive care for conditions that do not require medicines. This may include:  Resting.  Applying heat or cold packs to injured areas.  Limiting activities until pain decreases. HOME CARE INSTRUCTIONS  If you were prescribed an antibiotic medicine, finish it all even if you start to feel better.  Avoid any activities that bring on chest pain.  Do not use any tobacco products, including cigarettes, chewing tobacco, or electronic cigarettes. If you need help quitting, ask your health care provider.  Do not drink alcohol.  Take medicines only as directed by your health care provider.  Keep all follow-up visits as directed by your health care provider. This is important. This includes any further testing if your chest pain does not go away.  If heartburn is the cause for your chest pain, you may be told to keep your head raised (elevated) while sleeping. This reduces the chance that acid will go from your stomach into your esophagus.  Make lifestyle changes as directed by your health care provider. These may include:  Getting regular exercise. Ask your health care provider to suggest some activities that are safe for you.  Eating a heart-healthy diet. A registered dietitian can help you to learn healthy eating options.  Maintaining a healthy weight.  Managing diabetes, if necessary.  Reducing stress. SEEK MEDICAL CARE IF:  Your chest pain does not go away after treatment.  You have a rash with blisters on your chest.  You have a fever. SEEK IMMEDIATE MEDICAL CARE IF:   Your chest pain is worse.  You have an increasing cough, or you cough up blood.  You have severe abdominal pain.  You have severe weakness.  You faint.  You have chills.  You have sudden, unexplained chest discomfort.  You have sudden, unexplained discomfort in your arms, back, neck, or jaw.  You  have shortness of breath at any time.  You suddenly start to sweat, or your skin gets clammy.  You feel nauseous or you vomit.  You suddenly feel light-headed or dizzy.  Your heart begins to beat quickly, or it feels like it is skipping beats. These symptoms may represent a serious problem that is an emergency. Do not wait to see if the symptoms will go away. Get medical help right away. Call your local emergency services (911 in the U.S.). Do not drive yourself to the hospital.   This information is not intended to replace advice given to you by your health care provider. Make sure you discuss any questions you have with your health care provider.   Document Released: 04/28/2005 Document Revised: 08/09/2014 Document Reviewed: 02/22/2014 Elsevier Interactive Patient Education 2016 Elsevier Inc.  Chest Pain Observation It is often hard to give a specific diagnosis for the cause of chest pain. Among other possibilities your symptoms might be caused by inadequate oxygen delivery to your heart (angina). Angina that is not treated or evaluated can lead to a heart attack (myocardial infarction) or death. Blood tests, electrocardiograms, and X-rays may have been done to help determine a possible cause of your chest pain. After evaluation and observation, your health care provider has determined that it is unlikely your pain was caused by an unstable condition that requires hospitalization. However, a full evaluation of your pain may need to be completed, with additional diagnostic testing as directed. It is very important to keep your follow-up appointments. Not keeping your follow-up appointments could result in permanent heart damage, disability, or death. If there is any problem keeping your follow-up appointments, you must call your health care provider. HOME CARE INSTRUCTIONS  Due to the slight chance that your pain could be angina, it is important to follow your health care provider's treatment  plan and also maintain a healthy lifestyle:  Maintain or work toward achieving a healthy weight.  Stay physically active and exercise regularly.  Decrease your salt intake.  Eat a balanced, healthy diet. Talk to a dietitian to learn about heart-healthy foods.  Increase your fiber intake by including whole grains, vegetables, fruits, and nuts in your diet.  Avoid situations that cause stress, anger, or depression.  Take medicines as advised by your health care provider. Report any side effects to your health care provider. Do not stop medicines or adjust the dosages on your own.  Quit smoking. Do not use nicotine patches or gum until you check with your health care provider.  Keep your blood pressure, blood sugar, and cholesterol levels within normal limits.  Limit alcohol intake to no more than 1 drink per day for women who are not pregnant and 2 drinks per day for men.  Do not abuse drugs. SEEK IMMEDIATE MEDICAL CARE IF: You have severe chest pain or pressure which may include symptoms such as:  You feel pain or pressure in your arms, neck,  jaw, or back.  You have severe back or abdominal pain, feel sick to your stomach (nauseous), or throw up (vomit).  You are sweating profusely.  You are having a fast or irregular heartbeat.  You feel short of breath while at rest.  You notice increasing shortness of breath during rest, sleep, or with activity.  You have chest pain that does not get better after rest or after taking your usual medicine.  You wake from sleep with chest pain.  You are unable to sleep because you cannot breathe.  You develop a frequent cough or you are coughing up blood.  You feel dizzy, faint, or experience extreme fatigue.  You develop severe weakness, dizziness, fainting, or chills. Any of these symptoms may represent a serious problem that is an emergency. Do not wait to see if the symptoms will go away. Call your local emergency services (911 in  the U.S.). Do not drive yourself to the hospital. MAKE SURE YOU:  Understand these instructions.  Will watch your condition.  Will get help right away if you are not doing well or get worse.   This information is not intended to replace advice given to you by your health care provider. Make sure you discuss any questions you have with your health care provider.   Document Released: 08/21/2010 Document Revised: 07/24/2013 Document Reviewed: 01/18/2013 Elsevier Interactive Patient Education [employer]

## 2015-05-17 NOTE — ED Notes (Signed)
Pt stable, ambulatory, states understanding of discharge instructions 

## 2015-05-17 NOTE — ED Notes (Signed)
Pt. Having chest pain center of her chest sharp radiates into her rt. Neck . Pt. Arrived scared and hyperventilating.  Difficulty talking with pt. Due to her hyperventilating.  Pt. Is calming down at present time.  Skin is warm , pink and dry. GCS 15  She denies any n/v/d

## 2015-07-23 ENCOUNTER — Ambulatory Visit: Admission: AD | Admit: 2015-07-23 | Payer: Self-pay | Source: Ambulatory Visit | Admitting: Orthopedic Surgery

## 2015-07-23 ENCOUNTER — Encounter (HOSPITAL_COMMUNITY)
Admission: EM | Disposition: A | Discharge status: Home / Self Care | Payer: Self-pay | Source: Home / Self Care | Attending: Emergency Medicine

## 2015-07-23 ENCOUNTER — Encounter (HOSPITAL_COMMUNITY): Payer: Self-pay

## 2015-07-23 ENCOUNTER — Emergency Department (HOSPITAL_COMMUNITY): Payer: Medicaid Other | Admitting: Anesthesiology

## 2015-07-23 ENCOUNTER — Emergency Department (HOSPITAL_COMMUNITY)
Admission: EM | Admit: 2015-07-23 | Discharge: 2015-07-23 | Disposition: A | Discharge status: Home / Self Care | Payer: Medicaid Other | Attending: Emergency Medicine | Admitting: Emergency Medicine

## 2015-07-23 ENCOUNTER — Emergency Department (HOSPITAL_COMMUNITY): Payer: Medicaid Other

## 2015-07-23 DIAGNOSIS — Z885 Allergy status to narcotic agent status: Secondary | ICD-10-CM | POA: Diagnosis not present

## 2015-07-23 DIAGNOSIS — F17200 Nicotine dependence, unspecified, uncomplicated: Secondary | ICD-10-CM | POA: Diagnosis not present

## 2015-07-23 DIAGNOSIS — S52501A Unspecified fracture of the lower end of right radius, initial encounter for closed fracture: Secondary | ICD-10-CM

## 2015-07-23 DIAGNOSIS — Z9851 Tubal ligation status: Secondary | ICD-10-CM | POA: Insufficient documentation

## 2015-07-23 DIAGNOSIS — S52571A Other intraarticular fracture of lower end of right radius, initial encounter for closed fracture: Secondary | ICD-10-CM | POA: Insufficient documentation

## 2015-07-23 DIAGNOSIS — W19XXXA Unspecified fall, initial encounter: Secondary | ICD-10-CM

## 2015-07-23 HISTORY — PX: OPEN REDUCTION INTERNAL FIXATION (ORIF) DISTAL RADIAL FRACTURE: SHX5989

## 2015-07-23 LAB — CBC WITH DIFFERENTIAL/PLATELET
BASOS PCT: 0 %
Basophils Absolute: 0 10*3/uL (ref 0.0–0.1)
Eosinophils Absolute: 0.2 10*3/uL (ref 0.0–0.7)
Eosinophils Relative: 2 %
HEMATOCRIT: 39.3 % (ref 36.0–46.0)
HEMOGLOBIN: 13.4 g/dL (ref 12.0–15.0)
Lymphocytes Relative: 15 %
Lymphs Abs: 1.4 10*3/uL (ref 0.7–4.0)
MCH: 31.7 pg (ref 26.0–34.0)
MCHC: 34.1 g/dL (ref 30.0–36.0)
MCV: 92.9 fL (ref 78.0–100.0)
MONOS PCT: 4 %
Monocytes Absolute: 0.4 10*3/uL (ref 0.1–1.0)
NEUTROS ABS: 7.5 10*3/uL (ref 1.7–7.7)
NEUTROS PCT: 79 %
Platelets: 226 10*3/uL (ref 150–400)
RBC: 4.23 MIL/uL (ref 3.87–5.11)
RDW: 13 % (ref 11.5–15.5)
WBC: 9.5 10*3/uL (ref 4.0–10.5)

## 2015-07-23 LAB — BASIC METABOLIC PANEL
ANION GAP: 8 (ref 5–15)
BUN: 10 mg/dL (ref 6–20)
CALCIUM: 9 mg/dL (ref 8.9–10.3)
CHLORIDE: 110 mmol/L (ref 101–111)
CO2: 22 mmol/L (ref 22–32)
Creatinine, Ser: 0.67 mg/dL (ref 0.44–1.00)
GFR calc non Af Amer: >60 mL/min (ref >60)
Glucose, Bld: 94 mg/dL (ref 65–99)
Potassium: 4.2 mmol/L (ref 3.5–5.1)
Sodium: 140 mmol/L (ref 135–145)

## 2015-07-23 SURGERY — OPEN REDUCTION INTERNAL FIXATION (ORIF) DISTAL RADIUS FRACTURE
Anesthesia: Monitor Anesthesia Care | Laterality: Right

## 2015-07-23 MED ORDER — HYDROMORPHONE HCL 1 MG/ML IJ SOLN
1.0000 mg | Freq: Once | INTRAMUSCULAR | Status: AC
  Administered 2015-07-23: 1 mg via INTRAVENOUS
  Filled 2015-07-23: qty 1

## 2015-07-23 MED ORDER — ONDANSETRON HCL 4 MG/2ML IJ SOLN
INTRAMUSCULAR | Status: AC
  Filled 2015-07-23: qty 2

## 2015-07-23 MED ORDER — DIPHENHYDRAMINE HCL 50 MG/ML IJ SOLN: 50.0000 mg | Freq: Once | INTRAMUSCULAR | Status: DC

## 2015-07-23 MED ORDER — MIDAZOLAM HCL 2 MG/2ML IJ SOLN
INTRAMUSCULAR | Status: AC
  Filled 2015-07-23: qty 2

## 2015-07-23 MED ORDER — FENTANYL CITRATE (PF) 250 MCG/5ML IJ SOLN
INTRAMUSCULAR | Status: AC
  Filled 2015-07-23: qty 5

## 2015-07-23 MED ORDER — ONDANSETRON HCL 4 MG/2ML IJ SOLN
4.0000 mg | Freq: Once | INTRAMUSCULAR | Status: AC
  Administered 2015-07-23: 4 mg via INTRAVENOUS
  Filled 2015-07-23: qty 2

## 2015-07-23 MED ORDER — MIDAZOLAM HCL 2 MG/2ML IJ SOLN
INTRAMUSCULAR | Status: AC
Start: 2015-07-23 — End: 2015-07-23
  Administered 2015-07-23: 2 mg
  Filled 2015-07-23: qty 2

## 2015-07-23 MED ORDER — PROPOFOL 500 MG/50ML IV EMUL
INTRAVENOUS | Status: DC | PRN
  Administered 2015-07-23: 25 ug/kg/min via INTRAVENOUS

## 2015-07-23 MED ORDER — DOCUSATE SODIUM 100 MG PO CAPS: 100.0000 mg | ORAL_CAPSULE | Freq: Two times a day (BID) | ORAL | Status: DC

## 2015-07-23 MED ORDER — OXYCODONE-ACETAMINOPHEN 10-325 MG PO TABS: 1.0000 | ORAL_TABLET | ORAL | Status: DC | PRN

## 2015-07-23 MED ORDER — FENTANYL CITRATE (PF) 100 MCG/2ML IJ SOLN
50.0000 ug | Freq: Once | INTRAMUSCULAR | Status: AC
  Administered 2015-07-23 (×2): 50 ug via NASAL
  Filled 2015-07-23: qty 2

## 2015-07-23 MED ORDER — CEFAZOLIN SODIUM-DEXTROSE 2-3 GM-% IV SOLR
2.0000 g | INTRAVENOUS | Status: AC
  Administered 2015-07-23: 2 g via INTRAVENOUS

## 2015-07-23 MED ORDER — 0.9 % SODIUM CHLORIDE (POUR BTL) OPTIME
TOPICAL | Status: DC | PRN
  Administered 2015-07-23: 1000 mL

## 2015-07-23 MED ORDER — CEFAZOLIN SODIUM-DEXTROSE 2-3 GM-% IV SOLR
INTRAVENOUS | Status: AC
  Filled 2015-07-23: qty 50

## 2015-07-23 MED ORDER — PROPOFOL 10 MG/ML IV BOLUS
INTRAVENOUS | Status: DC | PRN
  Administered 2015-07-23: 20 mg via INTRAVENOUS

## 2015-07-23 MED ORDER — LACTATED RINGERS IV SOLN
INTRAVENOUS | Status: DC
  Administered 2015-07-23: 17:00:00 via INTRAVENOUS

## 2015-07-23 MED ORDER — BUPIVACAINE HCL (PF) 0.25 % IJ SOLN
INTRAMUSCULAR | Status: AC
  Filled 2015-07-23: qty 30

## 2015-07-23 MED ORDER — FENTANYL CITRATE (PF) 100 MCG/2ML IJ SOLN
INTRAMUSCULAR | Status: AC
  Administered 2015-07-23: 50 ug via NASAL
  Filled 2015-07-23: qty 2

## 2015-07-23 MED ORDER — MEPIVACAINE HCL 1.5 % IJ SOLN
INTRAMUSCULAR | Status: DC | PRN
  Administered 2015-07-23: 10 mL via PERINEURAL

## 2015-07-23 MED ORDER — METHOCARBAMOL 500 MG PO TABS: 500.0000 mg | ORAL_TABLET | Freq: Four times a day (QID) | ORAL | Status: DC

## 2015-07-23 MED ORDER — CHLORHEXIDINE GLUCONATE 4 % EX LIQD: 60.0000 mL | Freq: Once | CUTANEOUS | Status: DC

## 2015-07-23 MED ORDER — LORAZEPAM 2 MG/ML IJ SOLN
1.0000 mg | Freq: Once | INTRAMUSCULAR | Status: AC
  Administered 2015-07-23: 1 mg via INTRAVENOUS
  Filled 2015-07-23: qty 1

## 2015-07-23 MED ORDER — LACTATED RINGERS IV SOLN
INTRAVENOUS | Status: DC | PRN
  Administered 2015-07-23 (×2): via INTRAVENOUS

## 2015-07-23 MED ORDER — BUPIVACAINE-EPINEPHRINE (PF) 0.5% -1:200000 IJ SOLN
INTRAMUSCULAR | Status: DC | PRN
  Administered 2015-07-23: 20 mL via PERINEURAL

## 2015-07-23 SURGICAL SUPPLY — 72 items
BANDAGE ELASTIC 3 VELCRO ST LF (GAUZE/BANDAGES/DRESSINGS) ×3 IMPLANT
BANDAGE ELASTIC 4 VELCRO ST LF (GAUZE/BANDAGES/DRESSINGS) ×3 IMPLANT
BIT DRILL 2.2 SS TIBIAL (BIT) ×3 IMPLANT
BLADE SURG ROTATE 9660 (MISCELLANEOUS) IMPLANT
BNDG CMPR 9X4 STRL LF SNTH (GAUZE/BANDAGES/DRESSINGS) ×1
BNDG ESMARK 4X9 LF (GAUZE/BANDAGES/DRESSINGS) ×3 IMPLANT
BNDG GAUZE ELAST 4 BULKY (GAUZE/BANDAGES/DRESSINGS) ×3 IMPLANT
CANISTER SUCTION 2500CC (MISCELLANEOUS) ×3 IMPLANT
CLOSURE WOUND 1/2 X4 (GAUZE/BANDAGES/DRESSINGS)
CORDS BIPOLAR (ELECTRODE) ×3 IMPLANT
COVER SURGICAL LIGHT HANDLE (MISCELLANEOUS) ×3 IMPLANT
CUFF TOURNIQUET SINGLE 18IN (TOURNIQUET CUFF) ×3 IMPLANT
CUFF TOURNIQUET SINGLE 24IN (TOURNIQUET CUFF) IMPLANT
DECANTER SPIKE VIAL GLASS SM (MISCELLANEOUS) ×3 IMPLANT
DRAIN TLS ROUND 10FR (DRAIN) IMPLANT
DRAPE OEC MINIVIEW 54X84 (DRAPES) ×3 IMPLANT
DRAPE SURG 17X11 SM STRL (DRAPES) ×3 IMPLANT
DRSG ADAPTIC 3X8 NADH LF (GAUZE/BANDAGES/DRESSINGS) ×3 IMPLANT
ELECT REM PT RETURN 9FT ADLT (ELECTROSURGICAL)
ELECTRODE REM PT RTRN 9FT ADLT (ELECTROSURGICAL) IMPLANT
GAUZE SPONGE 4X4 12PLY STRL (GAUZE/BANDAGES/DRESSINGS) ×3 IMPLANT
GAUZE SPONGE 4X4 16PLY XRAY LF (GAUZE/BANDAGES/DRESSINGS) ×3 IMPLANT
GLOVE BIOGEL PI IND STRL 8.5 (GLOVE) ×1 IMPLANT
GLOVE BIOGEL PI INDICATOR 8.5 (GLOVE) ×2
GLOVE SURG ORTHO 8.0 STRL STRW (GLOVE) ×3 IMPLANT
GOWN STRL REUS W/ TWL LRG LVL3 (GOWN DISPOSABLE) ×1 IMPLANT
GOWN STRL REUS W/ TWL XL LVL3 (GOWN DISPOSABLE) ×1 IMPLANT
GOWN STRL REUS W/TWL LRG LVL3 (GOWN DISPOSABLE) ×3
GOWN STRL REUS W/TWL XL LVL3 (GOWN DISPOSABLE) ×3
K-WIRE 1.6 (WIRE) ×3
K-WIRE FX5X1.6XNS BN SS (WIRE) ×1
KIT BASIN OR (CUSTOM PROCEDURE TRAY) ×3 IMPLANT
KIT ROOM TURNOVER OR (KITS) ×3 IMPLANT
KWIRE FX5X1.6XNS BN SS (WIRE) ×1 IMPLANT
MANIFOLD NEPTUNE II (INSTRUMENTS) ×3 IMPLANT
NEEDLE HYPO 25X1 1.5 SAFETY (NEEDLE) ×3 IMPLANT
NS IRRIG 1000ML POUR BTL (IV SOLUTION) ×3 IMPLANT
PACK ORTHO EXTREMITY (CUSTOM PROCEDURE TRAY) ×3 IMPLANT
PAD ARMBOARD 7.5X6 YLW CONV (MISCELLANEOUS) ×6 IMPLANT
PAD CAST 4YDX4 CTTN HI CHSV (CAST SUPPLIES) ×1 IMPLANT
PADDING CAST ABS 3INX4YD NS (CAST SUPPLIES) ×2
PADDING CAST ABS 4INX4YD NS (CAST SUPPLIES) ×2
PADDING CAST ABS COTTON 3X4 (CAST SUPPLIES) ×1 IMPLANT
PADDING CAST ABS COTTON 4X4 ST (CAST SUPPLIES) ×1 IMPLANT
PADDING CAST COTTON 4X4 STRL (CAST SUPPLIES) ×3
PEG LOCKING SMOOTH 2.2X18 (Peg) ×9 IMPLANT
PEG LOCKING SMOOTH 2.2X20 (Screw) ×3 IMPLANT
PEG LOCKING SMOOTH 2.2X22 (Screw) ×9 IMPLANT
PLATE DVR CROSSLOCK STD RT (Plate) ×3 IMPLANT
SCREW LOCK 12X2.7X 3 LD (Screw) ×2 IMPLANT
SCREW LOCK 14X2.7X 3 LD TPR (Screw) ×2 IMPLANT
SCREW LOCKING 2.7X12MM (Screw) ×6 IMPLANT
SCREW LOCKING 2.7X14 (Screw) ×6 IMPLANT
SOAP 2 % CHG 4 OZ (WOUND CARE) ×3 IMPLANT
SPLINT FIBERGLASS 3X35 (CAST SUPPLIES) ×3 IMPLANT
SPONGE GAUZE 4X4 12PLY STER LF (GAUZE/BANDAGES/DRESSINGS) ×3 IMPLANT
SPONGE LAP 4X18 X RAY DECT (DISPOSABLE) ×3 IMPLANT
STRIP CLOSURE SKIN 1/2X4 (GAUZE/BANDAGES/DRESSINGS) IMPLANT
SUCTION FRAZIER TIP 8 FR DISP (SUCTIONS) ×2
SUCTION TUBE FRAZIER 8FR DISP (SUCTIONS) ×1 IMPLANT
SUT ETHILON 4 0 PS 2 18 (SUTURE) IMPLANT
SUT MNCRL AB 4-0 PS2 18 (SUTURE) IMPLANT
SUT VIC AB 2-0 FS1 27 (SUTURE) IMPLANT
SUT VICRYL 4-0 PS2 18IN ABS (SUTURE) IMPLANT
SYR CONTROL 10ML LL (SYRINGE) IMPLANT
SYSTEM CHEST DRAIN TLS 7FR (DRAIN) IMPLANT
TOWEL OR 17X24 6PK STRL BLUE (TOWEL DISPOSABLE) ×3 IMPLANT
TOWEL OR 17X26 10 PK STRL BLUE (TOWEL DISPOSABLE) ×3 IMPLANT
TUBE CONNECTING 12'X1/4 (SUCTIONS) ×1
TUBE CONNECTING 12X1/4 (SUCTIONS) ×2 IMPLANT
WATER STERILE IRR 1000ML POUR (IV SOLUTION) ×3 IMPLANT
YANKAUER SUCT BULB TIP NO VENT (SUCTIONS) IMPLANT

## 2015-07-23 NOTE — Brief Op Note (Signed)
07/23/2015  3:13 PM  PATIENT:  Dominique Perry  29 y.o. female  PRE-OPERATIVE DIAGNOSIS:  Right distal radius fracture  POST-OPERATIVE DIAGNOSIS:  * No post-op diagnosis entered *  PROCEDURE:  Procedure(s): OPEN REDUCTION INTERNAL FIXATION (ORIF) DISTAL RADIAL FRACTURE (Right)  SURGEON:  Surgeon(s) and Role:    * Bradly BienenstockFred Donie Lemelin, MD - Primary  PHYSICIAN ASSISTANT:   ASSISTANTS: none   ANESTHESIA:   regional  EBL:     BLOOD ADMINISTERED:none  DRAINS: none   LOCAL MEDICATIONS USED:  NONE  SPECIMEN:  No Specimen  DISPOSITION OF SPECIMEN:  N/A  COUNTS:  YES  TOURNIQUET:  * No tourniquets in log *  DICTATION: .Other Dictation: Dictation Number [id number]  PLAN OF CARE: Discharge to home after PACU  PATIENT DISPOSITION:  PACU - hemodynamically stable.   Delay start of Pharmacological VTE agent (>24hrs) due to surgical blood loss or risk of bleeding: not applicable

## 2015-07-23 NOTE — Anesthesia Preprocedure Evaluation (Addendum)
Anesthesia Evaluation  Patient identified by MRN, date of birth, ID band Patient awake    Reviewed: Allergy & Precautions, NPO status , Patient's Chart, lab work & pertinent test results  Airway Mallampati: II  TM Distance: >3 FB Neck ROM: Full    Dental  (+) Teeth Intact, Dental Advisory Given   Pulmonary Current Smoker,    Pulmonary exam normal breath sounds clear to auscultation       Cardiovascular Exercise Tolerance: Good (-) hypertensionNormal cardiovascular exam Rhythm:Regular Rate:Normal     Neuro/Psych negative neurological ROS  negative psych ROS   GI/Hepatic negative GI ROS, Neg liver ROS,   Endo/Other  negative endocrine ROS  Renal/GU negative Renal ROS  negative genitourinary   Musculoskeletal negative musculoskeletal ROS (+)   Abdominal   Peds  Hematology negative hematology ROS (+)   Anesthesia Other Findings Day of surgery medications reviewed with the patient.  Reproductive/Obstetrics negative OB ROS                            Anesthesia Physical Anesthesia Plan  ASA: II  Anesthesia Plan: Regional and MAC   Post-op Pain Management: MAC Combined w/ Regional for Post-op pain   Induction: Intravenous  Airway Management Planned: Nasal Cannula  Additional Equipment:   Intra-op Plan:   Post-operative Plan:   Informed Consent: I have reviewed the patients History and Physical, chart, labs and discussed the procedure including the risks, benefits and alternatives for the proposed anesthesia with the patient or authorized representative who has indicated his/her understanding and acceptance.   Dental advisory given  Plan Discussed with:   Anesthesia Plan Comments: (Risks/benefits of regional block discussed with patient including risk of bleeding, infection, nerve damage, and possibility of failed block.  Also discussed backup plan of general anesthesia and  associated risks.  Patient wishes to proceed.)        Anesthesia Quick Evaluation

## 2015-07-23 NOTE — H&P (Signed)
Dominique Perry is an 29 y.o. female.   Chief Complaint: right wrist injury HPI:  Dominique Perry is a 29 y.o. female, patient with no pertinent past medical history, presenting to the ED with a right wrist injury after falling off a three-step step stool, landing on the kitchen floor. Rates pain 10/10, throbbing, radiating into her right elbow. Pt has not taken anything for the pain. She states that she fell because she lost her balance and denies chest pain, shortness of breath, recent illness, dizziness, or nausea/vomiting. Pt denies hitting her head, LOC, neuro deficits.   Past Medical History  Diagnosis Date  . Ureteral stone with hydronephrosis   . Pyelonephritis   . Anemia     Past Surgical History  Procedure Laterality Date  . Tubal ligation    . Wrist surgery    . Cystoscopy with stent placement Right 09/12/2012    Procedure: CYSTOSCOPY WITH STENT PLACEMENT;  Surgeon: Bernestine Amass, MD;  Location: [facility];  Service: Urology;  Laterality: Right;  . Cystoscopy/retrograde/ureteroscopy/stone extraction with basket Right 09/26/2012    Procedure: RIGHT URETEROSCOPY/STONE EXTRACTION WITH STENT;  Surgeon: Malka So, MD;  Location: [facility];  Service: Urology;  Laterality: Right;  . Holmium laser application Right 09/09/221    Procedure: HOLMIUM LASER APPLICATION;  Surgeon: Malka So, MD;  Location: [facility];  Service: Urology;  Laterality: Right;    Family History  Problem Relation Age of Onset  . Hypertension Father   . Lung cancer Father    Social History:  reports that she has been smoking Cigarettes.  She has a 2.75 pack-year smoking history. She has never used smokeless tobacco. She reports that she drinks alcohol. She reports that she uses illicit drugs (Marijuana).  Allergies:  Allergies  Allergen Reactions  . Vicodin [Hydrocodone-Acetaminophen] Nausea Only     (Not in a hospital admission)  Results for orders placed or performed during the hospital encounter of 07/23/15  (from the past 48 hour(s))  Basic metabolic panel     Status: None   Collection Time: 07/23/15  1:30 PM  Result Value Ref Range   Sodium 140 135 - 145 mmol/L   Potassium 4.2 3.5 - 5.1 mmol/L   Chloride 110 101 - 111 mmol/L   CO2 22 22 - 32 mmol/L   Glucose, Bld 94 65 - 99 mg/dL   BUN 10 6 - 20 mg/dL   Creatinine, Ser 0.67 0.44 - 1.00 mg/dL   Calcium 9.0 8.9 - 10.3 mg/dL   GFR calc non Af Amer >60 >60 mL/min   GFR calc Af Amer >60 >60 mL/min    Comment: (NOTE) The eGFR has been calculated using the CKD EPI equation. This calculation has not been validated in all clinical situations. eGFR's persistently <60 mL/min signify possible Chronic Kidney Disease.    Anion gap 8 5 - 15  CBC with Differential     Status: None   Collection Time: 07/23/15  1:30 PM  Result Value Ref Range   WBC 9.5 4.0 - 10.5 K/uL   RBC 4.23 3.87 - 5.11 MIL/uL   Hemoglobin 13.4 12.0 - 15.0 g/dL   HCT 39.3 36.0 - 46.0 %   MCV 92.9 78.0 - 100.0 fL   MCH 31.7 26.0 - 34.0 pg   MCHC 34.1 30.0 - 36.0 g/dL   RDW 13.0 11.5 - 15.5 %   Platelets 226 150 - 400 K/uL   Neutrophils Relative % 79 %   Neutro Abs  7.5 1.7 - 7.7 K/uL   Lymphocytes Relative 15 %   Lymphs Abs 1.4 0.7 - 4.0 K/uL   Monocytes Relative 4 %   Monocytes Absolute 0.4 0.1 - 1.0 K/uL   Eosinophils Relative 2 %   Eosinophils Absolute 0.2 0.0 - 0.7 K/uL   Basophils Relative 0 %   Basophils Absolute 0.0 0.0 - 0.1 K/uL   Dg Forearm Right  07/23/2015  CLINICAL DATA:  [place] [address] down stairs with pain of the distal 4 arm. EXAM: RIGHT FOREARM - 2 VIEW COMPARISON:  None. FINDINGS: No fracture of the proximal radius or ulna. Fracture of the ulnar styloid. Comminuted fracture of the distal radius including a component extending to the articular surface. Standard wrist films suggested for better evaluation. IMPRESSION: No proximal fracture. Fracture of the ulnar styloid. Comminuted fracture of the distal radius, with a component extending to the articular surface.  Dedicated wrist evaluation suggested. Electronically Signed   By: Nelson Chimes M.D.   On: 07/23/2015 13:21   Dg Wrist Complete Right  07/23/2015  CLINICAL DATA:  Pain following fall down stairs EXAM: RIGHT WRIST - COMPLETE 3+ VIEW COMPARISON:  None. FINDINGS: Frontal, oblique, lateral, and ulnar deviation scaphoid images were obtained. There is a comminuted fracture of the distal radial metaphysis with dorsal angulation distally. There is mild impaction at the fracture site. A fracture fragment extends into the radiocarpal joint. There is avulsion of the ulnar styloid. No other fractures. No dislocations. Joint spaces appear intact. IMPRESSION: Comminuted fracture distal radial metaphysis with dorsal angulation distally and impaction at the fracture site. Fracture fragment extends into the radiocarpal joint. Avulsion of the ulnar styloid. No dislocations. No appreciable arthropathic change. Electronically Signed   By: Lowella Grip III M.D.   On: 07/23/2015 13:20   Dg Hand Complete Right  07/23/2015  CLINICAL DATA:  Tripped while running in flip-flops and fell down stairs, pain RIGHT elbow to wrist, initial encounter EXAM: RIGHT HAND - COMPLETE 3+ VIEW COMPARISON:  None FINDINGS: Fixed flexed on all views. Osseous mineralization normal. Joint spaces preserved. Displaced ulnar styloid fracture. Comminuted distal radial metaphyseal fracture with minimal apex volar angulation. Dorsal soft tissue swelling at carpus. No additional fracture, dislocation, or bone destruction. IMPRESSION: Displaced RIGHT ulnar styloid fracture. Angulated distal RIGHT radial metaphyseal fracture, better evaluated on RIGHT forearm radiographs. Electronically Signed   By: Lavonia Dana M.D.   On: 07/23/2015 13:30    ROS NO RECENT ILLNESSES OR HOSPITALIZATIONS  Blood pressure 131/75, pulse 64, temperature 97.8 F (36.6 C), temperature source Oral, resp. rate 20, height _0  (1.676 m), weight 70.308 kg (155 lb), last menstrual  period 07/07/2015, SpO2 97 %. Physical Exam  General Appearance:  Alert, cooperative, no distress, appears stated age  Head:  Normocephalic, without obvious abnormality, atraumatic  Eyes:  Pupils equal, conjunctiva/corneas clear,         Throat: Lips, mucosa, and tongue normal; teeth and gums normal  Neck: No visible masses     Lungs:   respirations unlabored  Chest Wall:  No tenderness or deformity  Heart:  Regular rate and rhythm,  Abdomen:   Soft, non-tender,         Extremities: RIGHT WRIST: SKIN INTACT, BLOCK IN PLACE FINGERS WARM WELL PERFUSED NO OPEN WOUNDS   Pulses: 2+ and symmetric  Skin: Skin color, texture, turgor normal, no rashes or lesions     Neurologic: Normal    Assessment/Plan RIGHT WRIST COMMINUTED INTRA-ARTICULAR DISTAL RADIUS FRACTURE, DISPLACED  RIGHT WRIST  OPEN REDUCTION AND INTERNAL FIXATION AND REPAIR AS INDICATED  R/B/A DISCUSSED WITH PT IN HOSPITAL.  PT VOICED UNDERSTANDING OF PLAN CONSENT SIGNED DAY OF SURGERY PT SEEN AND EXAMINED PRIOR TO OPERATIVE PROCEDURE/DAY OF SURGERY SITE MARKED. QUESTIONS ANSWERED WILL GO HOME FOLLOWING SURGERY  WE ARE PLANNING SURGERY FOR YOUR UPPER EXTREMITY. THE RISKS AND BENEFITS OF SURGERY INCLUDE BUT NOT LIMITED TO BLEEDING INFECTION, DAMAGE TO NEARBY NERVES ARTERIES TENDONS, FAILURE OF SURGERY TO ACCOMPLISH ITS INTENDED GOALS, PERSISTENT SYMPTOMS AND NEED FOR FURTHER SURGICAL INTERVENTION. WITH THIS IN MIND WE WILL PROCEED. I HAVE DISCUSSED WITH THE PATIENT THE PRE AND POSTOPERATIVE REGIMEN AND THE DOS AND DON'TS. PT VOICED UNDERSTANDING AND INFORMED CONSENT SIGNED.  Linna Hoff 07/23/2015, 3:13 PM

## 2015-07-23 NOTE — Anesthesia Procedure Notes (Addendum)
Anesthesia Regional Block:  Supraclavicular block  Pre-Anesthetic Checklist: ,, timeout performed, Correct Patient, Correct Site, Correct Laterality, Correct Procedure, Correct Position, site marked, Risks and benefits discussed,  Surgical consent,  Pre-op evaluation,  At surgeon's request and post-op pain management  Laterality: Right  Prep: chloraprep       Needles:  Injection technique: Single-shot  Needle Type: Echogenic Stimulator Needle     Needle Length: 10cm 10 cm Needle Gauge: 22 and 22 G    Additional Needles:  Procedures: ultrasound guided (picture in chart) Supraclavicular block Narrative:  Injection made incrementally with aspirations every 5 mL.  Performed by: Personally  Anesthesiologist: Cecile HearingURK, STEPHEN EDWARD  Additional Notes: Functioning IV was confirmed and monitors were applied.  A 100mm 22ga Arrow echogenic stimulator needle was used. Sterile prep and drape,hand hygiene and sterile gloves were used.  Negative aspiration and negative test dose prior to incremental administration of local anesthetic. The patient tolerated the procedure well.  Ultrasound guidance: relevent anatomy identified, needle position confirmed, local anesthetic spread visualized around nerve(s), vascular puncture avoided.  Image printed for medical record.

## 2015-07-23 NOTE — Addendum Note (Signed)
Addendum  created 07/23/15 2033 by Gwenyth Allegraichard Karren Newland, CRNA   Modules edited: Anesthesia Events, Narrator   Narrator:  Narrator: Event Log Edited

## 2015-07-23 NOTE — Progress Notes (Signed)
Richard, CRNA at bedside. 

## 2015-07-23 NOTE — ED Provider Notes (Signed)
CSN: [id number]     Arrival date & time 07/23/15  1219 History   First MD Initiated Contact with Patient 07/23/15 1223     Chief Complaint  Patient presents with  . Arm Injury     (Consider location/radiation/quality/duration/timing/severity/associated sxs/prior Treatment) HPI   Dominique Perry is a 29 y.o. female, patient with no pertinent past medical history, presenting to the ED with a right wrist injury after falling off a three-step step stool, landing on the kitchen floor. Rates pain 10/10, throbbing, radiating into her right elbow. Pt has not taken anything for the pain. She states that she fell because she lost her balance and denies chest pain, shortness of breath, recent illness, dizziness, or nausea/vomiting. Pt denies hitting her head, LOC, neuro deficits.     Past Medical History  Diagnosis Date  . Ureteral stone with hydronephrosis   . Pyelonephritis   . Anemia    Past Surgical History  Procedure Laterality Date  . Tubal ligation    . Wrist surgery    . Cystoscopy with stent placement Right 09/12/2012    Procedure: CYSTOSCOPY WITH STENT PLACEMENT;  Surgeon: Valetta Fuller, MD;  Location: [facility];  Service: Urology;  Laterality: Right;  . Cystoscopy/retrograde/ureteroscopy/stone extraction with basket Right 09/26/2012    Procedure: RIGHT URETEROSCOPY/STONE EXTRACTION WITH STENT;  Surgeon: Anner Crete, MD;  Location: [facility];  Service: Urology;  Laterality: Right;  . Holmium laser application Right 09/26/2012    Procedure: HOLMIUM LASER APPLICATION;  Surgeon: Anner Crete, MD;  Location: [facility];  Service: Urology;  Laterality: Right;   Family History  Problem Relation Age of Onset  . Hypertension Father   . Lung cancer Father    Social History  Substance Use Topics  . Smoking status: Current Every Day Smoker -- 0.25 packs/day for 11 years    Types: Cigarettes  . Smokeless tobacco: Never Used  . Alcohol Use: Yes     Comment: OCCAS   OB History    No data  available     Review of Systems  Constitutional: Negative for fever, chills and diaphoresis.  Respiratory: Negative for shortness of breath.   Cardiovascular: Negative for chest pain.  Gastrointestinal: Negative for nausea, vomiting and abdominal pain.  Musculoskeletal: Positive for arthralgias (Right wrist pain).  Neurological: Negative for dizziness, syncope, weakness, numbness and headaches.  All other systems reviewed and are negative.     Allergies  Vicodin  Home Medications   Prior to Admission medications   Medication Sig Start Date End Date Taking? Authorizing Provider  acetaminophen (TYLENOL) 325 MG tablet Take 650 mg by mouth every 6 (six) hours as needed for pain.    Historical Provider, MD  doxycycline (VIBRAMYCIN) 100 MG capsule Take 1 capsule (100 mg total) by mouth 2 (two) times daily. 12/02/14   Marisa Severin, MD  ondansetron (ZOFRAN ODT) 8 MG disintegrating tablet Take 1 tablet (8 mg total) by mouth every 8 (eight) hours as needed for nausea or vomiting. 12/02/14   Marisa Severin, MD  oxyCODONE-acetaminophen (PERCOCET) 5-325 MG per tablet 1 to 2 tablets every 6 hours as needed for pain. 12/02/14   Marisa Severin, MD  promethazine (PHENERGAN) 25 MG tablet Take 1 tablet (25 mg total) by mouth every 6 (six) hours as needed for nausea. 12/02/14   Marisa Severin, MD   BP 120/78 mmHg  Pulse 72  Temp(Src) 97.8 F (36.6 C) (Oral)  Resp 16  Ht  (1.676 m)  Wt 70.308  kg  BMI 25.03 kg/m2  SpO2 97%  LMP 07/07/2015 (Approximate) Physical Exam  Constitutional: She is oriented to person, place, and time. She appears well-developed and well-nourished. No distress.  HENT:  Head: Normocephalic and atraumatic.  Eyes: Conjunctivae are normal. Pupils are equal, round, and reactive to light.  Neck: Normal range of motion. Neck supple.  Cardiovascular: Normal rate, regular rhythm and normal heart sounds.   Pulmonary/Chest: Effort normal and breath sounds normal. No respiratory distress.   Abdominal: Soft. Bowel sounds are normal.  Musculoskeletal: She exhibits no edema or tenderness.  Full ROM in left arm, both legs, and spine. No paraspinal tenderness. Obvious deformity to right wrist on the dorsal side with swelling and bruising noted. Pulse, motor, sensory intact distal to the injury. Full ROM at elbow and shoulder.   Lymphadenopathy:    She has no cervical adenopathy.  Neurological: She is alert and oriented to person, place, and time. She has normal reflexes.  No sensory deficits. Strength 5/5 in all extremities.   Skin: Skin is warm and dry. She is not diaphoretic.  Nursing note and vitals reviewed.   ED Course  Procedures (including critical care time) Labs Review Labs Reviewed  BASIC METABOLIC PANEL  CBC WITH DIFFERENTIAL/PLATELET    Imaging Review Dg Forearm Right  07/23/2015  CLINICAL DATA:  [place] Seat down stairs with pain of the distal 4 arm. EXAM: RIGHT FOREARM - 2 VIEW COMPARISON:  None. FINDINGS: No fracture of the proximal radius or ulna. Fracture of the ulnar styloid. Comminuted fracture of the distal radius including a component extending to the articular surface. Standard wrist films suggested for better evaluation. IMPRESSION: No proximal fracture. Fracture of the ulnar styloid. Comminuted fracture of the distal radius, with a component extending to the articular surface. Dedicated wrist evaluation suggested. Electronically Signed   By: Paulina Fusi M.D.   On: 07/23/2015 13:21   Dg Wrist Complete Right  07/23/2015  CLINICAL DATA:  Pain following fall down stairs EXAM: RIGHT WRIST - COMPLETE 3+ VIEW COMPARISON:  None. FINDINGS: Frontal, oblique, lateral, and ulnar deviation scaphoid images were obtained. There is a comminuted fracture of the distal radial metaphysis with dorsal angulation distally. There is mild impaction at the fracture site. A fracture fragment extends into the radiocarpal joint. There is avulsion of the ulnar styloid. No other fractures. No  dislocations. Joint spaces appear intact. IMPRESSION: Comminuted fracture distal radial metaphysis with dorsal angulation distally and impaction at the fracture site. Fracture fragment extends into the radiocarpal joint. Avulsion of the ulnar styloid. No dislocations. No appreciable arthropathic change. Electronically Signed   By: Bretta Bang III M.D.   On: 07/23/2015 13:20   Dg Hand Complete Right  07/23/2015  CLINICAL DATA:  Tripped while running in flip-flops and fell down stairs, pain RIGHT elbow to wrist, initial encounter EXAM: RIGHT HAND - COMPLETE 3+ VIEW COMPARISON:  None FINDINGS: Fixed flexed on all views. Osseous mineralization normal. Joint spaces preserved. Displaced ulnar styloid fracture. Comminuted distal radial metaphyseal fracture with minimal apex volar angulation. Dorsal soft tissue swelling at carpus. No additional fracture, dislocation, or bone destruction. IMPRESSION: Displaced RIGHT ulnar styloid fracture. Angulated distal RIGHT radial metaphyseal fracture, better evaluated on RIGHT forearm radiographs. Electronically Signed   By: Ulyses Southward M.D.   On: 07/23/2015 13:30   I have personally reviewed and evaluated these images as part of my medical decision-making.   EKG Interpretation None      MDM   Final diagnoses:  Radius distal  fracture, right, closed, initial encounter  Fall, initial encounter    Dominique Perry presents with right wrist fracture from a fall today.  Findings and plan of care discussed with Alvira MondayErin Schlossman, MD.  1:27 PM patient states that her pain is not well controlled. Patient denies additional complaints. 1:46 PM patient states her pain is now controlled. Spoke with Dr. Orlan Leavensrtman who stated that he would review the patient's chart and x-rays and return my call. 2:12 PM Dr. Melvyn Novasrtmann states he will take the pt to the OR tonight and she can stay in the ED until that time. Requested an EKG due to patient's status as a smoker. No further  instructions. 4:04 PM patient states that her pain and her anxiety due to her pain have significantly improved and feels as though she may be able to sleep. Patient taken to the OR for preop prep. Patient required no further pain management.  Filed Vitals:   07/23/15 1225 07/23/15 1453 07/23/15 1538 07/23/15 1600  BP: 146/84 131/75 120/84 120/78  Pulse: 104 64 74 72  Temp: 97.8 F (36.6 C)     TempSrc: Oral     Resp: 18 20 16    Height: 5\' 6"  (1.676 m)     Weight: 70.308 kg     SpO2: 96% 97% 92% 97%     Anselm PancoastShawn C Brogen Duell, PA-C 07/23/15 1647  Alvira MondayErin Schlossman, MD 07/24/15 [id number]

## 2015-07-23 NOTE — Transfer of Care (Signed)
Immediate Anesthesia Transfer of Care Note  Patient: Dominique Perry  Procedure(s) Performed: Procedure(s): OPEN REDUCTION INTERNAL FIXATION (ORIF) DISTAL RADIAL FRACTURE (Right)  Patient Location: PACU  Anesthesia Type:MAC combined with regional for post-op pain  Level of Consciousness: awake, alert  and oriented  Airway & Oxygen Therapy: Patient Spontanous Breathing and Patient connected to nasal cannula oxygen  Post-op Assessment: Report given to RN and Post -op Vital signs reviewed and stable  Post vital signs: Reviewed and stable  Last Vitals:  Filed Vitals:   07/23/15 1710 07/23/15 1715  BP: 138/53 138/51  Pulse: 83 76  Temp:    Resp: 13 13    Complications: No apparent anesthesia complications

## 2015-07-23 NOTE — Anesthesia Postprocedure Evaluation (Signed)
Anesthesia Post Note  Patient: Edison NasutiLauren E Mars  Procedure(s) Performed: Procedure(s) (LRB): OPEN REDUCTION INTERNAL FIXATION (ORIF) DISTAL RADIAL FRACTURE (Right)  Patient location during evaluation: PACU Anesthesia Type: MAC and Regional Level of consciousness: awake and alert Pain management: pain level controlled Vital Signs Assessment: post-procedure vital signs reviewed and stable Respiratory status: spontaneous breathing, nonlabored ventilation and respiratory function stable Cardiovascular status: stable and blood pressure returned to baseline Anesthetic complications: no    Last Vitals:  Filed Vitals:   07/23/15 1930 07/23/15 1945  BP: 136/64   Pulse: 82   Temp:  36.6 C  Resp: 16     Last Pain:  Filed Vitals:   07/23/15 1957  PainSc: 10-Worst pain ever                 Alyza Artiaga,W. EDMOND

## 2015-07-23 NOTE — ED Notes (Signed)
Pt presents with obvious deformity to R wrist after falling off a step stool, landing on outstretched arm.

## 2015-07-23 NOTE — Discharge Instructions (Signed)
KEEP BANDAGE CLEAN AND DRY CALL OFFICE FOR F/U APPT [phone] IN 15 DAYS KEEP HAND ELEVATED ABOVE HEART OK TO APPLY ICE TO OPERATIVE AREA CONTACT OFFICE IF ANY WORSENING PAIN OR CONCERNS.  

## 2015-07-23 NOTE — Op Note (Signed)
NAMCharlann Boxer:  Perry, Dominique                ACCOUNT NO.:  [id number]  MEDICAL RECORD NO.:  [id number]  LOCATION:  [facility]                         FACILITY:  [facility]  PHYSICIAN:  Sharma CovertFred W. Natanael Saladin IV, M.D.DATE OF BIRTH:  09-May-1986  DATE OF PROCEDURE:  07/23/2015 DATE OF DISCHARGE:                              OPERATIVE REPORT   PREOPERATIVE DIAGNOSIS:  Right wrist intra-articular distal radius fracture 3 or more fragments.  POSTOPERATIVE DIAGNOSIS:  Right wrist intra-articular distal radius fracture 3 or more fragments.  ATTENDING SURGEON:  Sharma CovertFred W. Chrisoula Zegarra, M.D. who was scrubbed for the entire procedure.  ASSISTANT SURGEON:  None.  ANESTHESIA:  Supraclavicular block with IV sedation.  PROCEDURE: 1. Right wrist intra-articular distal radius fracture, open reduction     and internal fixation 3 or more fragments. 2. Right wrist brachioradialis tendon release and lengthening. 3. Radiographs 3 views, right wrist.  SURGICAL IMPLANTS:  DVR Crosslock plate.  Standard with distal locking pegs and screws, four screws proximally.  SURGICAL INDICATIONS:  [name] is a right-hand-dominant female, who fell and sustained a closed distal radius fracture.  The patient was seen and evaluated in the hospital and recommended to undergo the above procedure.  Risks, benefits, and alternatives were discussed in detail with the patient.  Signed informed consent was obtained.  Risks include, but not limited to bleeding; infection; damage to nearby nerves, arteries, or tendons; nonunion; malunion; hardware failure; loss of motion of wrist and digits; incomplete relief of symptoms; and need for further surgical intervention.  DESCRIPTION OF PROCEDURE:  The patient was properly identified in the preoperative holding area, marked with a permanent marker made on the right wrist to indicate correct operative site.  The patient was brought back to the operating room, placed supine on the anesthesia table, where the  block was administered.  The patient tolerated this well.  A well- padded tourniquet placed right brachium, sealed with 1000 drape.  Right upper extremity was then prepped and draped in a normal sterile fashion. Time-out was called, correct site  was identified and procedure then begun.  A longitudinal incision was made directly over the FCR sheath. Dissection was then carried down through the skin and subcutaneous tissue after tourniquet inflated.  The patient received preoperative antibiotics.  Going through the floor of the FCR sheath, the FPL was then carefully swept out of the way and pronator quadratus was then elevated in an L-shaped fashion.  Pronator quadratus was then elevated and the fracture site was exposed.  It was an intra-articular fracture 3 or more fragments.  Very carefully so the brachioradialis was then carried off the radial styloid and tendon tenotomy was then carried out of the right brachioradialis.  The wound was then thoroughly irrigated. An open reduction was then performed.  The volar plate was then applied and then fixed distally with a K-wire.  Plate position was then confirmed.  The oblong screw hole was then used proximally.  Distal fixation was then carried out moving from an ulnar to radial direction with the distal locking pegs.  These were confirmed using mini C-arm in terms of the placement and within the subchondral surface.  Further fixation was carried out  proximally with locking and nonlocking screws in the shaft.  Thorough wound irrigation done.  After the wound was thoroughly irrigated, pronator quadratus closed with 2-0 Vicryl, subcutaneous tissues closed with 4-0 Vicryl, skin closed with 4-0 Vicryl Rapide.  Adaptic dressing, sterile compressive bandage applied.  The patient placed in a well-padded sugar-tong splint, and taken to recovery room in good condition.  RADIOGRAPHIC INTERPRETATION:  AP, lateral, and oblique views of the wrist did  show the volar plate fixation in place and good position.  POSTPROCEDURAL PLAN:  The patient will be discharged home, seen back in the office in approximately 2 weeks for wound check, x-rays, and application of short-arm cast.  Total cast immobilization 4 weeks, and then begin an outpatient therapy regimen.  Radiographs at each visit.     Madelynn Done, M.D.     FWO/MEDQ  D:  07/23/2015  T:  07/23/2015  Job:  [id number]

## 2015-07-23 NOTE — Progress Notes (Signed)
Orthopedic Tech Progress Note Patient Details:  Dominique Perry 10/30/1985 [id number]  Ortho Devices Type of Ortho Device: Arm sling Ortho Device/Splint Location: rue Ortho Device/Splint Interventions: Ordered, Application   [name], [name] 07/23/2015, 7:36 PM

## 2015-07-24 ENCOUNTER — Encounter (HOSPITAL_COMMUNITY): Payer: Self-pay | Admitting: Orthopedic Surgery

## 2015-07-24 LAB — POCT PREGNANCY, URINE: PREG TEST UR: NEGATIVE

## 2015-08-21 ENCOUNTER — Ambulatory Visit: Payer: MEDICAID | Admitting: Occupational Therapy

## 2015-08-22 ENCOUNTER — Ambulatory Visit: Payer: MEDICAID | Attending: Orthopedic Surgery | Admitting: Occupational Therapy

## 2016-02-17 DIAGNOSIS — F17210 Nicotine dependence, cigarettes, uncomplicated: Secondary | ICD-10-CM | POA: Diagnosis not present

## 2016-02-17 DIAGNOSIS — Z79899 Other long term (current) drug therapy: Secondary | ICD-10-CM | POA: Diagnosis not present

## 2016-02-17 DIAGNOSIS — Z7982 Long term (current) use of aspirin: Secondary | ICD-10-CM | POA: Diagnosis not present

## 2016-02-17 DIAGNOSIS — R1030 Lower abdominal pain, unspecified: Secondary | ICD-10-CM | POA: Insufficient documentation

## 2016-02-17 DIAGNOSIS — R1011 Right upper quadrant pain: Secondary | ICD-10-CM | POA: Insufficient documentation

## 2016-02-18 ENCOUNTER — Emergency Department (HOSPITAL_COMMUNITY): Payer: Medicaid Other

## 2016-02-18 ENCOUNTER — Encounter (HOSPITAL_COMMUNITY): Payer: Self-pay | Admitting: Emergency Medicine

## 2016-02-18 ENCOUNTER — Emergency Department (HOSPITAL_COMMUNITY)
Admission: EM | Admit: 2016-02-18 | Discharge: 2016-02-18 | Disposition: A | Discharge status: Home / Self Care | Payer: Medicaid Other | Attending: Emergency Medicine | Admitting: Emergency Medicine

## 2016-02-18 DIAGNOSIS — R1011 Right upper quadrant pain: Secondary | ICD-10-CM

## 2016-02-18 LAB — COMPREHENSIVE METABOLIC PANEL
ALT: 26 U/L (ref 14–54)
AST: 25 U/L (ref 15–41)
Albumin: 4 g/dL (ref 3.5–5.0)
Alkaline Phosphatase: 80 U/L (ref 38–126)
Anion gap: 7 (ref 5–15)
BUN: 13 mg/dL (ref 6–20)
CO2: 23 mmol/L (ref 22–32)
Calcium: 9.1 mg/dL (ref 8.9–10.3)
Chloride: 108 mmol/L (ref 101–111)
Creatinine, Ser: 0.68 mg/dL (ref 0.44–1.00)
GFR calc Af Amer: >60 mL/min (ref >60)
GFR calc non Af Amer: >60 mL/min (ref >60)
Glucose, Bld: 133 mg/dL — ABNORMAL HIGH (ref 65–99)
Potassium: 3.6 mmol/L (ref 3.5–5.1)
Sodium: 138 mmol/L (ref 135–145)
Total Bilirubin: 0.2 mg/dL — ABNORMAL LOW (ref 0.3–1.2)
Total Protein: 7.3 g/dL (ref 6.5–8.1)

## 2016-02-18 LAB — CBC
HCT: 36.6 % (ref 36.0–46.0)
Hemoglobin: 12.3 g/dL (ref 12.0–15.0)
MCH: 31.1 pg (ref 26.0–34.0)
MCHC: 33.6 g/dL (ref 30.0–36.0)
MCV: 92.4 fL (ref 78.0–100.0)
Platelets: 308 10*3/uL (ref 150–400)
RBC: 3.96 MIL/uL (ref 3.87–5.11)
RDW: 12.8 % (ref 11.5–15.5)
WBC: 9.7 10*3/uL (ref 4.0–10.5)

## 2016-02-18 LAB — I-STAT BETA HCG BLOOD, ED (MC, WL, AP ONLY): I-stat hCG, quantitative: <5 m[IU]/mL (ref <5)

## 2016-02-18 LAB — URINALYSIS, ROUTINE W REFLEX MICROSCOPIC
Bilirubin Urine: NEGATIVE
Glucose, UA: NEGATIVE mg/dL
Hgb urine dipstick: NEGATIVE
Ketones, ur: NEGATIVE mg/dL
Leukocytes, UA: NEGATIVE
Nitrite: NEGATIVE
Protein, ur: NEGATIVE mg/dL
Specific Gravity, Urine: 1.029 (ref 1.005–1.030)
pH: 5.5 (ref 5.0–8.0)

## 2016-02-18 LAB — TSH: TSH: 3.558 u[IU]/mL (ref 0.350–4.500)

## 2016-02-18 MED ORDER — OXYCODONE-ACETAMINOPHEN 5-325 MG PO TABS: 1.0000 | ORAL_TABLET | ORAL | Status: DC | PRN

## 2016-02-18 MED ORDER — SODIUM CHLORIDE 0.9 % IV BOLUS (SEPSIS)
1000.0000 mL | Freq: Once | INTRAVENOUS | Status: AC
  Administered 2016-02-18: 1000 mL via INTRAVENOUS

## 2016-02-18 MED ORDER — IOPAMIDOL (ISOVUE-300) INJECTION 61%
100.0000 mL | Freq: Once | INTRAVENOUS | Status: AC | PRN
  Administered 2016-02-18: 100 mL via INTRAVENOUS

## 2016-02-18 MED ORDER — IBUPROFEN 200 MG PO TABS
600.0000 mg | ORAL_TABLET | Freq: Once | ORAL | Status: AC
  Administered 2016-02-18: 600 mg via ORAL
  Filled 2016-02-18: qty 3

## 2016-02-18 MED ORDER — DIPHENHYDRAMINE HCL 50 MG/ML IJ SOLN
25.0000 mg | Freq: Once | INTRAMUSCULAR | Status: AC
  Administered 2016-02-18: 25 mg via INTRAVENOUS
  Filled 2016-02-18: qty 1

## 2016-02-18 MED ORDER — OXYCODONE-ACETAMINOPHEN 5-325 MG PO TABS
1.0000 | ORAL_TABLET | Freq: Once | ORAL | Status: AC
  Administered 2016-02-18: 1 via ORAL
  Filled 2016-02-18: qty 1

## 2016-02-18 MED ORDER — MORPHINE SULFATE (PF) 4 MG/ML IV SOLN
4.0000 mg | Freq: Once | INTRAVENOUS | Status: AC
Start: 2016-02-18 — End: 2016-02-18
  Administered 2016-02-18: 4 mg via INTRAVENOUS
  Filled 2016-02-18: qty 1

## 2016-02-18 MED ORDER — KETOROLAC TROMETHAMINE 15 MG/ML IJ SOLN
15.0000 mg | Freq: Once | INTRAMUSCULAR | Status: AC
Start: 2016-02-18 — End: 2016-02-18
  Administered 2016-02-18: 15 mg via INTRAVENOUS
  Filled 2016-02-18: qty 1

## 2016-02-18 NOTE — ED Provider Notes (Signed)
CSN: [id number]     Arrival date & time 02/17/16  2358 History  By signing my name below, I, Emmanuella Mensah, attest that this documentation has been prepared under the direction and in the presence of Raeford Razor, MD. Electronically Signed: Angelene Giovanni, ED Scribe. 02/18/2016. 12:50 AM.    Chief Complaint  Patient presents with  . Flank Pain   The history is provided by the patient. No language interpreter was used.   HPI Comments: Dominique Perry is a 30 y.o. female with a hx of ureteral stone with hydronephrosis and pyelonephritis who presents to the Emergency Department complaining of gradually worsening moderate right flank pain onset 5 days ago. Pt reports associated SOB and nausea. She adds that she has been experiencing ongoing bilateral ankle/wrist swelling and fatigue onset one month ago. She states that she has been gaining more weight within the past few months. No alleviating factors noted. Pt has not tried any medications PTA. She notes that these symptoms are consistent to when she had kidney stones in the past. She denies a hx of thyroid issues. She denies any fever, chills, dysuria, vomiting, or diarrhea.    Past Medical History  Diagnosis Date  . Ureteral stone with hydronephrosis   . Pyelonephritis   . Anemia    Past Surgical History  Procedure Laterality Date  . Tubal ligation    . Wrist surgery    . Cystoscopy with stent placement Right 09/12/2012    Procedure: CYSTOSCOPY WITH STENT PLACEMENT;  Surgeon: Valetta Fuller, MD;  Location: [facility];  Service: Urology;  Laterality: Right;  . Cystoscopy/retrograde/ureteroscopy/stone extraction with basket Right 09/26/2012    Procedure: RIGHT URETEROSCOPY/STONE EXTRACTION WITH STENT;  Surgeon: Anner Crete, MD;  Location: [facility];  Service: Urology;  Laterality: Right;  . Holmium laser application Right 09/26/2012    Procedure: HOLMIUM LASER APPLICATION;  Surgeon: Anner Crete, MD;  Location: [facility];  Service: Urology;   Laterality: Right;  . Open reduction internal fixation (orif) distal radial fracture Right 07/23/2015    Procedure: OPEN REDUCTION INTERNAL FIXATION (ORIF) DISTAL RADIAL FRACTURE;  Surgeon: Bradly Bienenstock, MD;  Location: [facility];  Service: Orthopedics;  Laterality: Right;   Family History  Problem Relation Age of Onset  . Hypertension Father   . Lung cancer Father    Social History  Substance Use Topics  . Smoking status: Current Every Day Smoker -- 0.25 packs/day for 11 years    Types: Cigarettes  . Smokeless tobacco: Never Used  . Alcohol Use: Yes     Comment: OCCAS   OB History    No data available     Review of Systems  Constitutional: Positive for unexpected weight change. Negative for fever and chills.  Respiratory: Positive for shortness of breath.   Gastrointestinal: Positive for nausea. Negative for vomiting and diarrhea.  Genitourinary: Positive for flank pain. Negative for dysuria.  All other systems reviewed and are negative.     Allergies  Vicodin  Home Medications   Prior to Admission medications   Medication Sig Start Date End Date Taking? Authorizing Provider  Aspirin-Salicylamide-Caffeine (BC HEADACHE POWDER PO) Take 1 each by mouth as needed (pain).   Yes Historical Provider, MD  docusate sodium (COLACE) 100 MG capsule Take 1 capsule (100 mg total) by mouth 2 (two) times daily. Patient not taking: Reported on 02/18/2016 07/23/15   Bradly Bienenstock, MD  doxycycline (VIBRAMYCIN) 100 MG capsule Take 1 capsule (100 mg total) by mouth 2 (two)  times daily. Patient not taking: Reported on 02/18/2016 12/02/14   Marisa Severinlga Otter, MD  methocarbamol (ROBAXIN) 500 MG tablet Take 1 tablet (500 mg total) by mouth 4 (four) times daily. Patient not taking: Reported on 02/18/2016 07/23/15   Bradly BienenstockFred Ortmann, MD  ondansetron (ZOFRAN ODT) 8 MG disintegrating tablet Take 1 tablet (8 mg total) by mouth every 8 (eight) hours as needed for nausea or vomiting. Patient not taking: Reported on  02/18/2016 12/02/14   Marisa Severinlga Otter, MD  oxyCODONE-acetaminophen (PERCOCET) 10-325 MG tablet Take 1 tablet by mouth every 4 (four) hours as needed for pain. Patient not taking: Reported on 02/18/2016 07/23/15   Bradly BienenstockFred Ortmann, MD  oxyCODONE-acetaminophen (PERCOCET) 5-325 MG per tablet 1 to 2 tablets every 6 hours as needed for pain. Patient not taking: Reported on 02/18/2016 12/02/14   Marisa Severinlga Otter, MD  promethazine (PHENERGAN) 25 MG tablet Take 1 tablet (25 mg total) by mouth every 6 (six) hours as needed for nausea. Patient not taking: Reported on 02/18/2016 12/02/14   Marisa Severinlga Otter, MD   BP 145/76 mmHg  Pulse 78  Temp(Src) 98.3 F (36.8 C) (Oral)  Resp 18  Ht 5\' 6"  (1.676 m)  Wt 170 lb (77.111 kg)  BMI 27.45 kg/m2  SpO2 100% Physical Exam  Constitutional: She appears well-developed and well-nourished. No distress.  HENT:  Head: Normocephalic and atraumatic.  Right Ear: External ear normal.  Left Ear: External ear normal.  Eyes: Conjunctivae are normal. Right eye exhibits no discharge. Left eye exhibits no discharge. No scleral icterus.  Neck: Neck supple. No tracheal deviation present.  Cardiovascular: Normal rate, regular rhythm and intact distal pulses.   Pulmonary/Chest: Effort normal and breath sounds normal. No stridor. No respiratory distress. She has no wheezes. She has no rales.  Abdominal: Soft. Bowel sounds are normal. She exhibits no distension. There is no tenderness. There is no rebound and no guarding.  Right flank tenderness, no CVA tenderness  Musculoskeletal: She exhibits edema. She exhibits no tenderness.  Symmetric non-pitting lower edema  Neurological: She is alert. She has normal strength. No cranial nerve deficit (no facial droop, extraocular movements intact, no slurred speech) or sensory deficit. She exhibits normal muscle tone. She displays no seizure activity. Coordination normal.  Skin: Skin is warm and dry. No rash noted.  Psychiatric: She has a normal mood and affect.   Nursing note and vitals reviewed.   ED Course  Procedures (including critical care time) DIAGNOSTIC STUDIES: Oxygen Saturation is 100% on RA, normal by my interpretation.    COORDINATION OF CARE: 12:49 AM- Pt advised of plan for treatment and pt agrees. Pt will receive lab work for further evaluation.   Labs Review Labs Reviewed  COMPREHENSIVE METABOLIC PANEL - Abnormal; Notable for the following:       Result Value   Glucose, Bld 133 (*)    Total Bilirubin 0.2 (*)    All other components within normal limits  URINALYSIS, ROUTINE W REFLEX MICROSCOPIC (NOT [facility])  CBC  TSH  I-STAT BETA HCG BLOOD, ED ([facility], [facility], [facility] ONLY)    Raeford RazorStephen Yariela Tison, MD has personally reviewed and evaluated these lab results as part of his medical decision-making.   MDM   Final diagnoses:  RUQ pain   I personally preformed the services scribed in my presence. The recorded information has been reviewed is accurate. Raeford RazorStephen Natsuko Kelsay, MD.   Raeford RazorStephen Corynn Solberg, MD 03/04/16 2200

## 2016-02-18 NOTE — ED Notes (Addendum)
Patient complaining of Right flank pain. Patient has been nauseas and both feet are swollen. Patient says her breathing has changed like she is breathing heavier.

## 2016-02-18 NOTE — Discharge Instructions (Signed)
Flank Pain °Flank pain refers to pain that is located on the side of the body between the upper abdomen and the back. The pain may occur over a short period of time (acute) or may be long-term or reoccurring (chronic). It may be mild or severe. Flank pain can be caused by many things. °CAUSES  °Some of the more common causes of flank pain include: °· Muscle strains.   °· Muscle spasms.   °· A disease of your spine (vertebral disk disease).   °· A lung infection (pneumonia).   °· Fluid around your lungs (pulmonary edema).   °· A kidney infection.   °· Kidney stones.   °· A very painful skin rash caused by the chickenpox virus (shingles).   °· Gallbladder disease.   °HOME CARE INSTRUCTIONS  °Home care will depend on the cause of your pain. In general, °· Rest as directed by your caregiver. °· Drink enough fluids to keep your urine clear or pale yellow. °· Only take over-the-counter or prescription medicines as directed by your caregiver. Some medicines may help relieve the pain. °· Tell your caregiver about any changes in your pain. °· Follow up with your caregiver as directed. °SEEK IMMEDIATE MEDICAL CARE IF:  °· Your pain is not controlled with medicine.   °· You have new or worsening symptoms. °· Your pain increases.   °· You have abdominal pain.   °· You have shortness of breath.   °· You have persistent nausea or vomiting.   °· You have swelling in your abdomen.   °· You feel faint or pass out.   °· You have blood in your urine. °· You have a fever or persistent symptoms for more than 2-3 days. °· You have a fever and your symptoms suddenly get worse. °MAKE SURE YOU:  °· Understand these instructions. °· Will watch your condition. °· Will get help right away if you are not doing well or get worse. °  °This information is not intended to replace advice given to you by your health care provider. Make sure you discuss any questions you have with your health care provider. °  °Document Released: 09/09/2005 Document  Revised: 04/12/2012 Document Reviewed: 03/02/2012 °Elsevier Interactive Patient Education ©2016 [employer] ° °

## 2016-10-04 ENCOUNTER — Encounter (HOSPITAL_COMMUNITY): Payer: Self-pay

## 2016-10-04 ENCOUNTER — Emergency Department (HOSPITAL_COMMUNITY)
Admission: EM | Admit: 2016-10-04 | Discharge: 2016-10-04 | Disposition: A | Discharge status: Home / Self Care | Payer: Medicaid Other | Attending: Emergency Medicine | Admitting: Emergency Medicine

## 2016-10-04 ENCOUNTER — Emergency Department (HOSPITAL_COMMUNITY): Payer: Medicaid Other

## 2016-10-04 DIAGNOSIS — Y999 Unspecified external cause status: Secondary | ICD-10-CM | POA: Diagnosis not present

## 2016-10-04 DIAGNOSIS — F17210 Nicotine dependence, cigarettes, uncomplicated: Secondary | ICD-10-CM | POA: Insufficient documentation

## 2016-10-04 DIAGNOSIS — Y92410 Unspecified street and highway as the place of occurrence of the external cause: Secondary | ICD-10-CM | POA: Diagnosis not present

## 2016-10-04 DIAGNOSIS — X509XXA Other and unspecified overexertion or strenuous movements or postures, initial encounter: Secondary | ICD-10-CM | POA: Insufficient documentation

## 2016-10-04 DIAGNOSIS — S9031XA Contusion of right foot, initial encounter: Secondary | ICD-10-CM | POA: Insufficient documentation

## 2016-10-04 DIAGNOSIS — Z7982 Long term (current) use of aspirin: Secondary | ICD-10-CM | POA: Insufficient documentation

## 2016-10-04 DIAGNOSIS — S99911A Unspecified injury of right ankle, initial encounter: Secondary | ICD-10-CM | POA: Diagnosis present

## 2016-10-04 DIAGNOSIS — Y939 Activity, unspecified: Secondary | ICD-10-CM | POA: Diagnosis not present

## 2016-10-04 MED ORDER — OXYCODONE-ACETAMINOPHEN 5-325 MG PO TABS: 1.0000 | ORAL_TABLET | Freq: Four times a day (QID) | ORAL | 0 refills | Status: AC | PRN

## 2016-10-04 MED ORDER — IBUPROFEN 800 MG PO TABS: 800.0000 mg | ORAL_TABLET | Freq: Three times a day (TID) | ORAL | 0 refills | Status: AC

## 2016-10-04 NOTE — ED Notes (Signed)
PA at bedside.

## 2016-10-04 NOTE — ED Notes (Signed)
Ortho paged. 

## 2016-10-04 NOTE — ED Notes (Signed)
Ortho at bedside.

## 2016-10-04 NOTE — ED Provider Notes (Signed)
[facility] Provider Note   CSN: [id number] Arrival date & time: 10/04/16  1940     History   Chief Complaint Chief Complaint  Patient presents with  . Ankle Pain  . Foot Pain    HPI Dominique NasutiLauren E Perry is a 31 y.o. female who presents to the Ed today with chief complaint of right ankle pain with associated numbness and tingling. She states she was leaving her storage unit at around 5:30pm today when she mis-stepped on uneven ground and "rolled my ankle in". She immediately felt 5/10 pain and was unable to bear weight on the extremity. Pt subsequently developed swelling around the ankle joint, numbness along the dorsum of her right foot, and tingling at the toes of her right foot.   Denies LOC, HA, dizziness, CP/SOB,N/V/D, fever/chills.   The history is provided by the patient.  Ankle Pain   The incident occurred 3 to 5 hours ago. The incident occurred in the street. The injury mechanism was a fall. The pain is present in the right ankle. The pain is at a severity of 7/10. The pain is moderate. The pain has been constant since onset. Associated symptoms include numbness, inability to bear weight, loss of motion and tingling. She reports no foreign bodies present. The symptoms are aggravated by bearing weight and palpation. She has tried nothing for the symptoms.    Past Medical History:  Diagnosis Date  . Anemia   . Pyelonephritis   . Ureteral stone with hydronephrosis     Patient Active Problem List   Diagnosis Date Noted  . Hypokalemia 09/13/2012  . Normocytic anemia 09/13/2012  . Pyelonephritis 09/12/2012  . Hydronephrosis 09/12/2012  . Tobacco abuse 09/12/2012    Past Surgical History:  Procedure Laterality Date  . CYSTOSCOPY WITH STENT PLACEMENT Right 09/12/2012   Procedure: CYSTOSCOPY WITH STENT PLACEMENT;  Surgeon: Valetta Fulleravid S Grapey, MD;  Location: [facility];  Service: Urology;  Laterality: Right;  . CYSTOSCOPY/RETROGRADE/URETEROSCOPY/STONE EXTRACTION WITH BASKET Right  09/26/2012   Procedure: RIGHT URETEROSCOPY/STONE EXTRACTION WITH STENT;  Surgeon: Anner CreteJohn J Wrenn, MD;  Location: [facility];  Service: Urology;  Laterality: Right;  . HOLMIUM LASER APPLICATION Right 09/26/2012   Procedure: HOLMIUM LASER APPLICATION;  Surgeon: Anner CreteJohn J Wrenn, MD;  Location: [facility];  Service: Urology;  Laterality: Right;  . OPEN REDUCTION INTERNAL FIXATION (ORIF) DISTAL RADIAL FRACTURE Right 07/23/2015   Procedure: OPEN REDUCTION INTERNAL FIXATION (ORIF) DISTAL RADIAL FRACTURE;  Surgeon: Bradly BienenstockFred Ortmann, MD;  Location: [facility];  Service: Orthopedics;  Laterality: Right;  . TUBAL LIGATION    . WRIST SURGERY      OB History    No data available       Home Medications    Prior to Admission medications   Medication Sig Start Date End Date Taking? Authorizing Provider  Aspirin-Salicylamide-Caffeine (BC HEADACHE POWDER PO) Take 1 each by mouth as needed (pain).    Historical Provider, MD  ibuprofen (ADVIL,MOTRIN) 800 MG tablet Take 1 tablet (800 mg total) by mouth 3 (three) times daily. 10/04/16 10/07/16  Lexey Fletes A Cordell Coke, PA  ondansetron (ZOFRAN ODT) 8 MG disintegrating tablet Take 1 tablet (8 mg total) by mouth every 8 (eight) hours as needed for nausea or vomiting. Patient not taking: Reported on 02/18/2016 12/02/14   Marisa Severinlga Otter, MD  oxyCODONE-acetaminophen (PERCOCET/ROXICET) 5-325 MG tablet Take 1 tablet by mouth every 6 (six) hours as needed for severe pain. 10/04/16 10/06/16  Jeanie SewerMina A Carlos Heber, PA  promethazine (PHENERGAN) 25 MG tablet Take 1  tablet (25 mg total) by mouth every 6 (six) hours as needed for nausea. Patient not taking: Reported on 02/18/2016 12/02/14   Marisa Severin, MD    Family History Family History  Problem Relation Age of Onset  . Hypertension Father   . Lung cancer Father     Social History Social History  Substance Use Topics  . Smoking status: Current Every Day Smoker    Packs/day: 0.25    Years: 11.00    Types: Cigarettes  . Smokeless tobacco: Never Used  . Alcohol use Yes      Comment: OCCAS     Allergies   Vicodin [hydrocodone-acetaminophen]   Review of Systems Review of Systems  Constitutional: Negative for chills and fever.  Cardiovascular: Positive for leg swelling. Negative for chest pain.  Gastrointestinal: Negative for abdominal distention, abdominal pain, constipation, diarrhea, nausea and vomiting.  Genitourinary: Negative for dysuria and hematuria.  Musculoskeletal: Positive for arthralgias and joint swelling.  Skin: Negative.   Neurological: Positive for tingling and numbness. Negative for dizziness and headaches.     Physical Exam Updated Vital Signs BP 125/72 (BP Location: Right Arm)   Pulse 65   Temp 98.7 F (37.1 C) (Oral)   Resp 16   Ht 5\' 7"  (1.702 m)   Wt 90.7 kg   LMP 09/20/2016 (Approximate)   SpO2 100%   BMI 31.32 kg/m   Physical Exam  Constitutional: She is oriented to person, place, and time. She appears well-developed and well-nourished. No distress.  HENT:  Head: Normocephalic and atraumatic.  Eyes: Conjunctivae are normal. Right eye exhibits no discharge. Left eye exhibits no discharge. No scleral icterus.  Neck: No JVD present. No tracheal deviation present.  Cardiovascular: Normal rate, regular rhythm, normal heart sounds and intact distal pulses.   Pulmonary/Chest: Effort normal and breath sounds normal.  Musculoskeletal: She exhibits edema and tenderness.  Limited ROM, but pt is able to plantarflex, dorsiflex, and evert right ankle with minimal discomfort. Inversion of ankle is painful. 2cm bruise on lateral aspect of dorsum of foot  Neurological: She is alert and oriented to person, place, and time. A sensory deficit is present.  Numbness at dorsum of right foot, tingling at right toes   Skin: Skin is warm and dry. Capillary refill takes less than 2 seconds. She is not diaphoretic.  Psychiatric: She has a normal mood and affect. Her behavior is normal. Judgment and thought content normal.     ED  Treatments / Results  Labs (all labs ordered are listed, but only abnormal results are displayed) Labs Reviewed - No data to display  EKG  EKG Interpretation None       Radiology Dg Ankle Complete Right  Result Date: 10/04/2016 CLINICAL DATA:  Lateral foot and ankle pain after twisting injury tonight. EXAM: RIGHT ANKLE - COMPLETE 3+ VIEW COMPARISON:  None. FINDINGS: There is no evidence of fracture, dislocation, or joint effusion. There is no evidence of arthropathy or other focal bone abnormality. Soft tissues are unremarkable. IMPRESSION: Negative. Electronically Signed   By: Ellery Plunk M.D.   On: 10/04/2016 21:14   Dg Foot Complete Right  Result Date: 10/04/2016 CLINICAL DATA:  Lateral foot and ankle pain after twisting injury tonight. EXAM: RIGHT FOOT COMPLETE - 3+ VIEW COMPARISON:  None. FINDINGS: There is no evidence of fracture or dislocation. There is no evidence of arthropathy or other focal bone abnormality. Soft tissues are unremarkable. IMPRESSION: Negative. Electronically Signed   By: Rosey Bath.D.  On: 10/04/2016 21:15    Procedures Procedures (including critical care time)  Medications Ordered in ED Medications - No data to display   Initial Impression / Assessment and Plan / ED Course  I have reviewed the triage vital signs and the nursing notes.  Pertinent labs & imaging results that were available during my care of the patient were reviewed by me and considered in my medical decision making (see chart for details).    30yof presents to the ED with ankle swelling and pain after inverting her ankle while falling, then developed numbness at the dorsum of the foot and tingling at the toes. ROM decreased but pt able to evert, plantarflex, and dorsiflex with minimal discomfort; significant discomfort inverting the foot and with palpation. Extremity is neurovascularly intact, sensation intact with good peripheral pulses. Xrays of ankle and foot show no  acute pathology. Neuro symptoms likely secondary to inflammation of surrounding tissues and initial irritation of the structures near the ankle; likely self-limiting and will resolve given time and rest. Pt instructed to RICE, encouraged to take NSAIDs and short course of pain medication prn (given rx for Iburpofen 800mg  and percocet 5-325mg ), and fitted for ASO ankle brace and crutches in ED. Instructed to follow up with PCP as needed and to return to ED if symptoms worsen.   Final Clinical Impressions(s) / ED Diagnoses   Final diagnoses:  Injury of right ankle, initial encounter    New Prescriptions New Prescriptions   IBUPROFEN (ADVIL,MOTRIN) 800 MG TABLET    Take 1 tablet (800 mg total) by mouth 3 (three) times daily.   OXYCODONE-ACETAMINOPHEN (PERCOCET/ROXICET) 5-325 MG TABLET    Take 1 tablet by mouth every 6 (six) hours as needed for severe pain.     Jeanie Sewer, [place] 10/04/16 2207    Rolland Porter, MD 10/20/16 [phone]

## 2016-10-04 NOTE — ED Triage Notes (Signed)
Pt states twisted R ankle this evening. Pt with some decreased ROM. 1+ pedal pulse, cap refill <3s. Pt complaining of pain and tingling in R foot. Pt states unable to bear weight.

## 2016-12-23 IMAGING — CR DG FOREARM 2V*R*
2 series · 2 of 2 positions shown · non-contrast
Comparison: None.

CLINICAL DATA: Fell down stairs with pain of the distal 4 arm.

EXAM:
RIGHT FOREARM - 2 VIEW

[forearm ap]
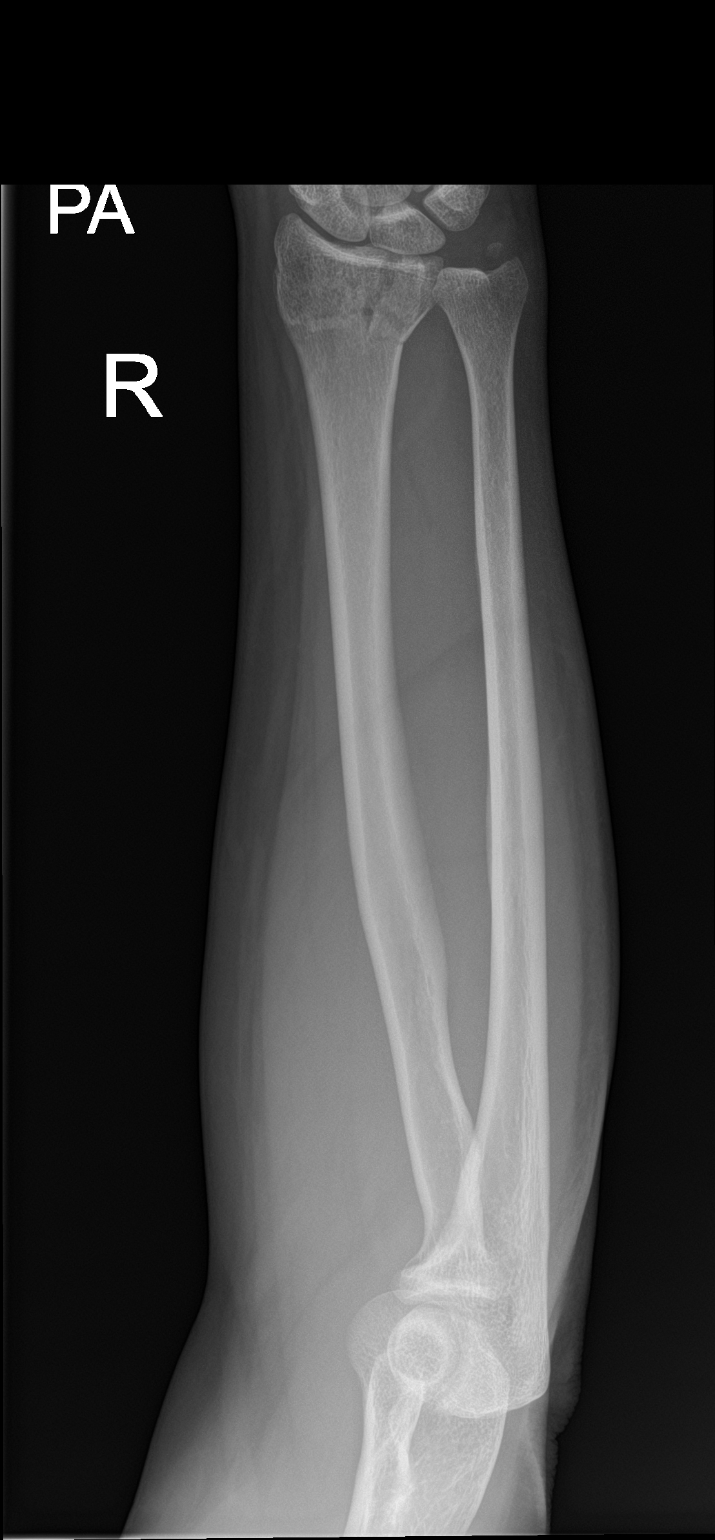

[forearm lat]
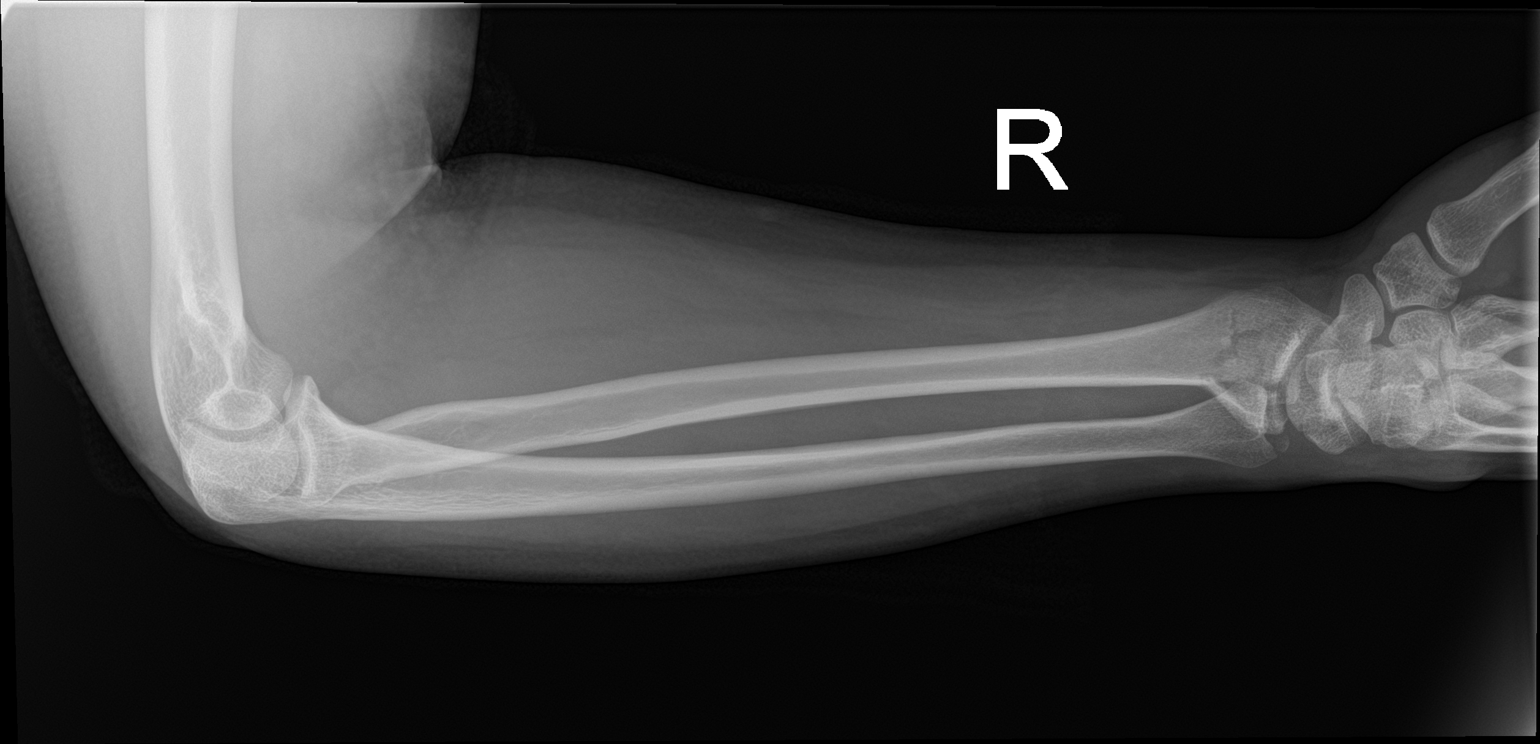

[2 of 2 positions shown; findings below may reference images not displayed]

FINDINGS: No fracture of the proximal radius or ulna. Fracture of the ulnar
styloid. Comminuted fracture of the distal radius including a
component extending to the articular surface. Standard wrist films
suggested for better evaluation.
IMPRESSION: No proximal fracture. Fracture of the ulnar styloid. Comminuted
fracture of the distal radius, with a component extending to the
articular surface. Dedicated wrist evaluation suggested.

## 2016-12-23 IMAGING — CR DG WRIST COMPLETE 3+V*R*
4 series · 4 of 4 positions shown · non-contrast
Comparison: None.

CLINICAL DATA: Pain following fall down stairs

EXAM:
RIGHT WRIST - COMPLETE 3+ VIEW

[wrist pa]
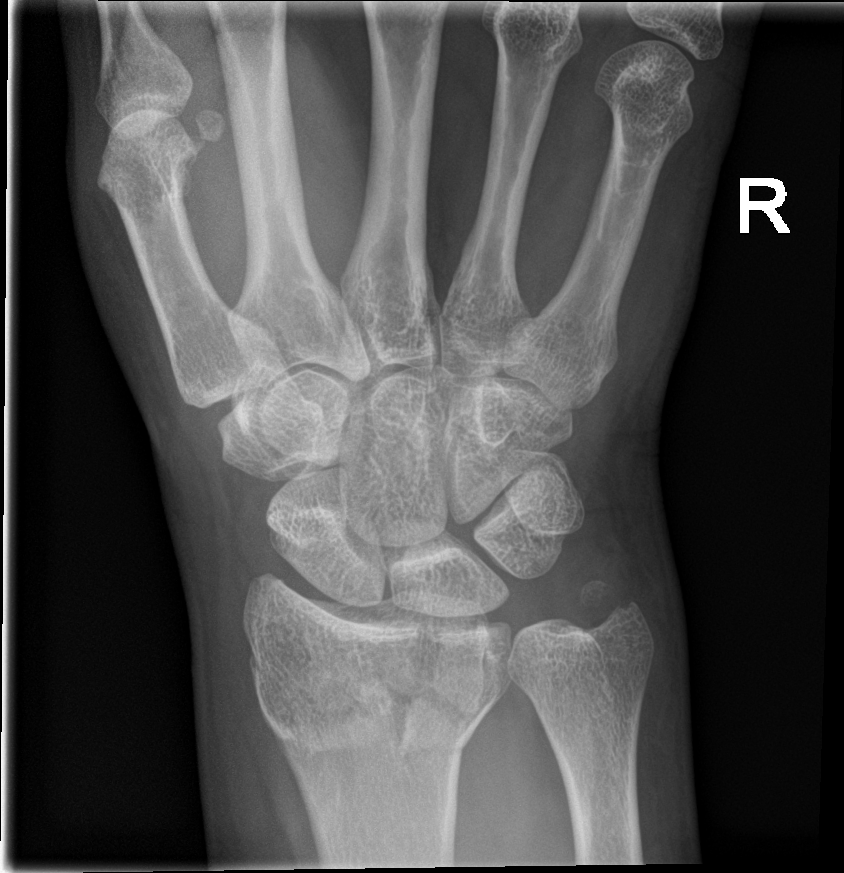

[wrist obl]
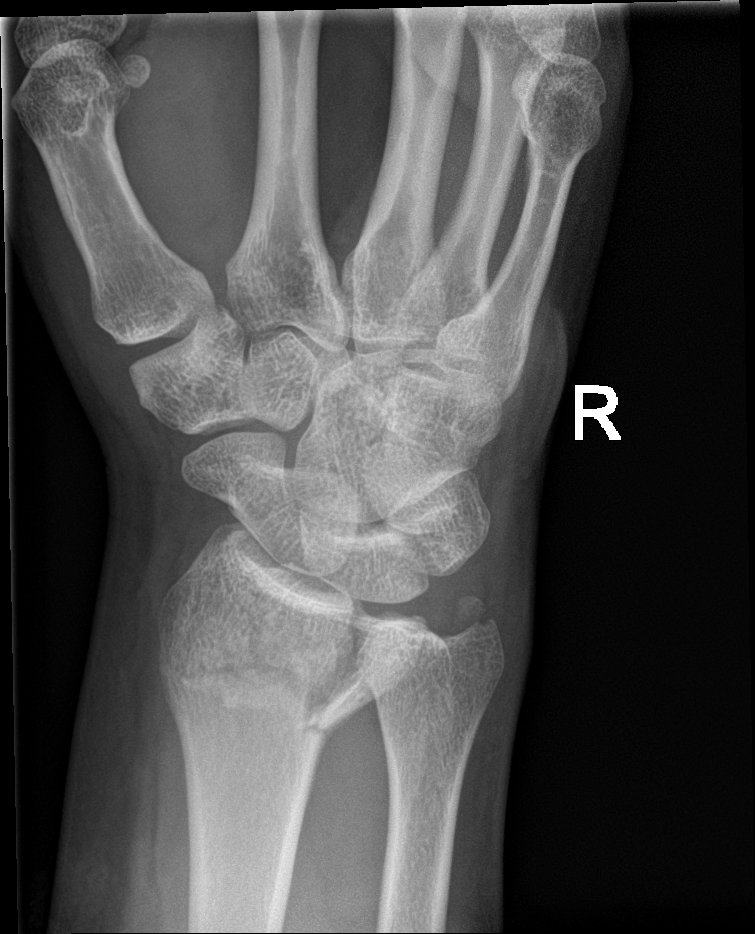

[wrist lat]
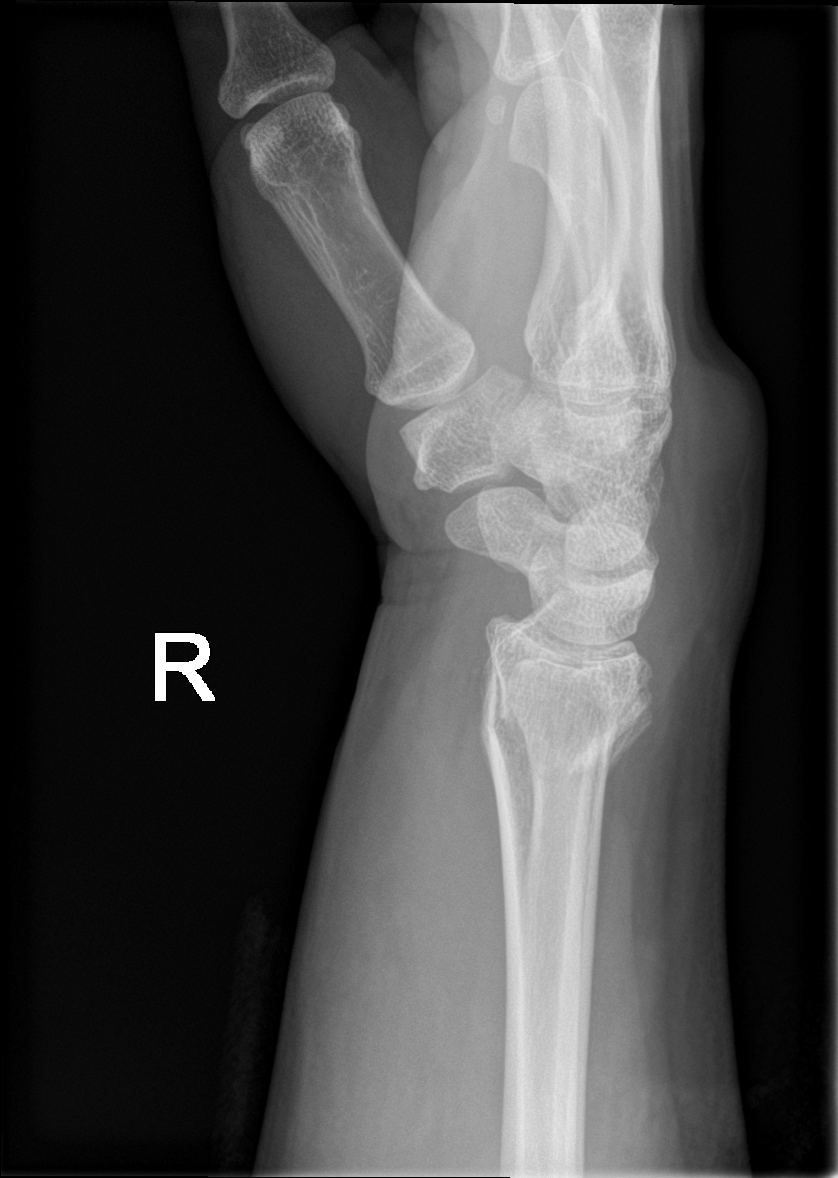

[wrist navicular]
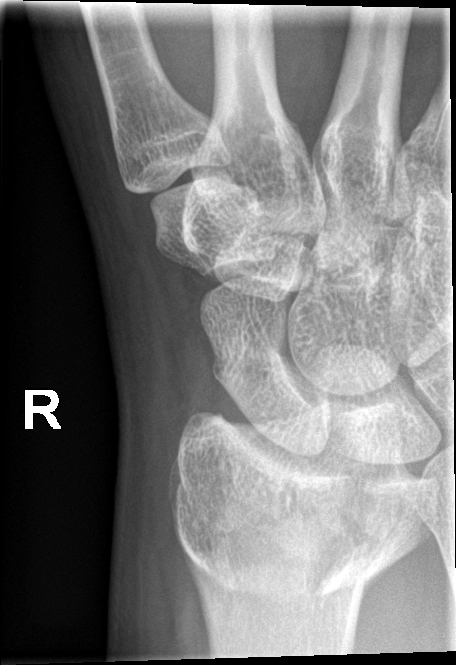

[4 of 4 positions shown; findings below may reference images not displayed]

FINDINGS: Frontal, oblique, lateral, and ulnar deviation scaphoid images were
obtained. There is a comminuted fracture of the distal radial
metaphysis with dorsal angulation distally. There is mild impaction
at the fracture site. A fracture fragment extends into the
radiocarpal joint. There is avulsion of the ulnar styloid. No other
fractures. No dislocations. Joint spaces appear intact.
IMPRESSION: Comminuted fracture distal radial metaphysis with dorsal angulation
distally and impaction at the fracture site. Fracture fragment
extends into the radiocarpal joint. Avulsion of the ulnar styloid.
No dislocations. No appreciable arthropathic change.

## 2017-01-27 MED FILL — ADDERALL XR 15 MG CAP SA: 15 | 30 days supply | Qty: 60 | Fill #0

## 2017-03-04 MED FILL — ADDERALL XR 15 MG CAP SA: 15 | 30 days supply | Qty: 60 | Fill #0

## 2017-08-10 ENCOUNTER — Ambulatory Visit: Payer: Self-pay | Admitting: Nurse Practitioner

## 2017-08-10 ENCOUNTER — Encounter: Payer: Self-pay | Admitting: Nurse Practitioner

## 2017-08-10 VITALS — BP 110/64 | HR 73 | Temp 98.8°F | Wt 181.4 lb

## 2017-08-10 DIAGNOSIS — H6981 Other specified disorders of Eustachian tube, right ear: Secondary | ICD-10-CM

## 2017-08-10 DIAGNOSIS — J209 Acute bronchitis, unspecified: Secondary | ICD-10-CM

## 2017-08-10 MED ORDER — ALBUTEROL SULFATE HFA 108 (90 BASE) MCG/ACT IN AERS: 2.0000 | INHALATION_SPRAY | Freq: Four times a day (QID) | RESPIRATORY_TRACT | 0 refills | Status: DC | PRN

## 2017-08-10 MED ORDER — PREDNISONE 10 MG (21) PO TBPK: ORAL_TABLET | ORAL | 0 refills | Status: AC

## 2017-08-10 NOTE — Patient Instructions (Addendum)
Eustachian Tube Dysfunction The eustachian tube connects the middle ear to the back of the nose. It regulates air pressure in the middle ear by allowing air to move between the ear and nose. It also helps to drain fluid from the middle ear space. When the eustachian tube does not function properly, air pressure, fluid, or both can build up in the middle ear. Eustachian tube dysfunction can affect one or both ears. What are the causes? This condition happens when the eustachian tube becomes blocked or cannot open normally. This may result from:  Ear infections.  Colds and other upper respiratory infections.  Allergies.  Irritation, such as from cigarette smoke or acid from the stomach coming up into the esophagus (gastroesophageal reflux).  Sudden changes in air pressure, such as from descending in an airplane.  Abnormal growths in the nose or throat, such as nasal polyps, tumors, or enlarged tissue at the back of the throat (adenoids).  What increases the risk? This condition may be more likely to develop in people who smoke and people who are overweight. Eustachian tube dysfunction may also be more likely to develop in children, especially children who have:  Certain birth defects of the mouth, such as cleft palate.  Large tonsils and adenoids.  What are the signs or symptoms? Symptoms of this condition may include:  A feeling of fullness in the ear.  Ear pain.  Clicking or popping noises in the ear.  Ringing in the ear.  Hearing loss.  Loss of balance.  Symptoms may get worse when the air pressure around you changes, such as when you travel to an area of high elevation or fly on an airplane. How is this diagnosed? This condition may be diagnosed based on:  Your symptoms.  A physical exam of your ear, nose, and throat.  Tests, such as those that measure: ? The movement of your eardrum (tympanogram). ? Your hearing (audiometry).  How is this treated? Treatment  depends on the cause and severity of your condition. If your symptoms are mild, you may be able to relieve your symptoms by moving air into ("popping") your ears. If you have symptoms of fluid in your ears, treatment may include:  Decongestants.  Antihistamines.  Nasal sprays or ear drops that contain medicines that reduce swelling (steroids).  In some cases, you may need to have a procedure to drain the fluid in your eardrum (myringotomy). In this procedure, a small tube is placed in the eardrum to:  Drain the fluid.  Restore the air in the middle ear space.  Follow these instructions at home:  Take over-the-counter and prescription medicines only as told by your health care provider.  Use techniques to help pop your ears as recommended by your health care provider. These may include: ? Chewing gum. ? Yawning. ? Frequent, forceful swallowing. ? Closing your mouth, holding your nose closed, and gently blowing as if you are trying to blow air out of your nose.  Do not do any of the following until your health care provider approves: ? Travel to high altitudes. ? Fly in airplanes. ? Work in a Estate agentpressurized cabin or room. ? Scuba dive.  Keep your ears dry. Dry your ears completely after showering or bathing.  Do not smoke.  Keep all follow-up visits as told by your health care provider. This is important.  Take Benadryl 1 tablet at bedtime until symptoms improve. Contact a health care provider if:  Your symptoms do not go away after treatment.  Your symptoms come back after treatment.  You are unable to pop your ears.  You have: ? A fever. ? Pain in your ear. ? Pain in your head or neck. ? Fluid draining from your ear.  Your hearing suddenly changes.  You become very dizzy.  You lose your balance. This information is not intended to replace advice given to you by your health care provider. Make sure you discuss any questions you have with your health care  provider. Document Released: 08/15/2015 Document Revised: 12/25/2015 Document Reviewed: 08/07/2014 Elsevier Interactive Patient Education  2018 [employer].  Acute Bronchitis, Adult Acute bronchitis is sudden (acute) swelling of the air tubes (bronchi) in the lungs. Acute bronchitis causes these tubes to fill with mucus, which can make it hard to breathe. It can also cause coughing or wheezing. In adults, acute bronchitis usually goes away within 2 weeks. A cough caused by bronchitis may last up to 3 weeks. Smoking, allergies, and asthma can make the condition worse. Repeated episodes of bronchitis may cause further lung problems, such as chronic obstructive pulmonary disease (COPD). What are the causes? This condition can be caused by germs and by substances that irritate the lungs, including:  Cold and flu viruses. This condition is most often caused by the same virus that causes a cold.  Bacteria.  Exposure to tobacco smoke, dust, fumes, and air pollution.  What increases the risk? This condition is more likely to develop in people who:  Have close contact with someone with acute bronchitis.  Are exposed to lung irritants, such as tobacco smoke, dust, fumes, and vapors.  Have a weak immune system.  Have a respiratory condition such as asthma.  What are the signs or symptoms? Symptoms of this condition include:  A cough.  Coughing up clear, yellow, or green mucus.  Wheezing.  Chest congestion.  Shortness of breath.  A fever.  Body aches.  Chills.  A sore throat.  How is this diagnosed? This condition is usually diagnosed with a physical exam. During the exam, your health care provider may order tests, such as chest X-rays, to rule out other conditions. He or she may also:  Test a sample of your mucus for bacterial infection.  Check the level of oxygen in your blood. This is done to check for pneumonia.  Do a chest X-ray or lung function testing to rule out  pneumonia and other conditions.  Perform blood tests.  Your health care provider will also ask about your symptoms and medical history. How is this treated? Most cases of acute bronchitis clear up over time without treatment. Your health care provider may recommend:  Drinking more fluids. Drinking more makes your mucus thinner, which may make it easier to breathe.  Taking a medicine for a fever or cough.  Taking an antibiotic medicine.  Using an inhaler to help improve shortness of breath and to control a cough.  Using a cool mist vaporizer or humidifier to make it easier to breathe.  Follow these instructions at home: Medicines  Take over-the-counter and prescription medicines only as told by your health care provider.  If you were prescribed an antibiotic, take it as told by your health care provider. Do not stop taking the antibiotic even if you start to feel better.  Continue over-the-counter cough medicine.  Take Sterapred (Prednisone) as directed. General instructions  Get plenty of rest.  Drink enough fluids to keep your urine clear or pale yellow.  Avoid smoking and secondhand smoke. Exposure  to cigarette smoke or irritating chemicals will make bronchitis worse. If you smoke and you need help quitting, ask your health care provider. Quitting smoking will help your lungs heal faster.  Use an inhaler, cool mist vaporizer, or humidifier as told by your health care provider.  Keep all follow-up visits as told by your health care provider. This is important. How is this prevented? To lower your risk of getting this condition again:  Wash your hands often with soap and water. If soap and water are not available, use hand sanitizer.  Avoid contact with people who have cold symptoms.  Try not to touch your hands to your mouth, nose, or eyes.  Make sure to get the flu shot every year.  Contact a health care provider if:  Your symptoms do not improve in 2 weeks of  treatment. Get help right away if:  You cough up blood.  You have chest pain.  You have severe shortness of breath.  You become dehydrated.  You faint or keep feeling like you are going to faint.  You keep vomiting.  You have a severe headache.  Your fever or chills gets worse. This information is not intended to replace advice given to you by your health care provider. Make sure you discuss any questions you have with your health care provider. Document Released: 08/26/2004 Document Revised: 02/11/2016 Document Reviewed: 01/07/2016 Elsevier Interactive Patient Education  [employer].

## 2017-08-10 NOTE — Progress Notes (Signed)
Subjective:  Dominique Perry is a 32 y.o. female who presents for evaluation of URI like symptoms.  Symptoms include right ear pressure/pain, sinus pressure, sneezing and sore throat. Patient states her nasal symptoms have improved, but she continues with DOE, when talking, walking, or physical activity.  Patient has a cough, but states the Robitussin has helped.  Denies fever, runny nose, chills, sweats, abd pain, nausea or vomiting. Onset of symptoms was 5 days ago, and has been gradually worsening since that time.  Treatment to date:  cough suppressants.  High risk factors for influenza complications:  none.  The following portions of the patient's history were reviewed and updated as appropriate:  allergies, current medications and past medical history.  Constitutional: positive for fatigue Eyes: negative Ears, nose, mouth, throat, and face: positive for earaches, nasal congestion, sore throat and hearing is muffled on right side, negative for ear drainage and hearing loss Respiratory: positive for cough and dyspnea on exertion, negative for asthma and wheezing Cardiovascular: negative Neurological: positive for headaches Behavioral/Psych: negative Allergic/Immunologic: negative Objective:  BP 110/64   Pulse 73   Temp 98.8 F (37.1 C)   Wt 181 lb 6.4 oz (82.3 kg)   SpO2 99%   BMI 28.41 kg/m  General appearance: alert, cooperative and no distress Head: Normocephalic, without obvious abnormality, atraumatic Eyes: conjunctivae/corneas clear. PERRL, EOM's intact. Fundi benign. Ears: normal TM and external ear canal left ear and abnormal TM right ear - mucoid middle ear fluid and moderate Nose: no discharge, turbinates swollen, inflamed, sinus tenderness maxillary, mild bilaterally Throat: lips, mucosa, and tongue normal; teeth and gums normal Lungs: clear to auscultation bilaterally Heart: regular rate and rhythm, S1, S2 normal, no murmur, click, rub or gallop Pulses: 2+ and  symmetric Skin: Skin color, texture, turgor normal. No rashes or lesions Lymph nodes: cervical adenopathy right neck, mild, submandibular nodes normal Neurologic: Grossly normal    Assessment:  Acute Bronchitis and Right Eustachian Tube Dysfunction    Plan:  Discussed diagnosis and treatment of URI. Educational material distributed and questions answered. Suggested symptomatic OTC remedies. Supportive care with appropriate antipyretics and fluids. Sterapred Dose Pack and Albuterol inhaler per orders. Follow up as needed. Call in 3-5 days if symptoms aren't resolving. Patient will use Benadryl at bedtime for Eustachian Tube Dysfunction and will continue use of Robitussin Honey.

## 2017-08-12 ENCOUNTER — Telehealth: Payer: Self-pay | Admitting: Emergency Medicine

## 2017-08-12 NOTE — Telephone Encounter (Signed)
Called patient about her rsent visit with [facility] and she statd she is feeling much better and that she did not have any questions at this time

## 2018-07-13 ENCOUNTER — Other Ambulatory Visit: Payer: Self-pay

## 2018-07-13 DIAGNOSIS — L0390 Cellulitis, unspecified: Secondary | ICD-10-CM | POA: Insufficient documentation

## 2018-07-13 DIAGNOSIS — F17210 Nicotine dependence, cigarettes, uncomplicated: Secondary | ICD-10-CM | POA: Insufficient documentation

## 2018-07-13 LAB — URINALYSIS, ROUTINE W REFLEX MICROSCOPIC
BILIRUBIN URINE: NEGATIVE
Bacteria, UA: NONE SEEN
GLUCOSE, UA: NEGATIVE mg/dL
Ketones, ur: NEGATIVE mg/dL
LEUKOCYTES UA: NEGATIVE
NITRITE: NEGATIVE
PH: 6 (ref 5.0–8.0)
Protein, ur: NEGATIVE mg/dL
SPECIFIC GRAVITY, URINE: 1.004 — AB (ref 1.005–1.030)

## 2018-07-13 LAB — PREGNANCY, URINE: Preg Test, Ur: NEGATIVE

## 2018-07-13 NOTE — ED Triage Notes (Signed)
Pt to ED with c/o of Flank pain on her left side. Pt states she has hx of kidney problems and states she has been feeling pain for the past month. Pt was trying to hold off until she got health insurance but her flank pain is now too much for her with pain 5/10. Pt states that her insect bite on her right forearm is also really bothering her 4/10. Pt is A&O x4 and ambulatory.

## 2018-07-14 ENCOUNTER — Emergency Department (HOSPITAL_COMMUNITY)
Admission: EM | Admit: 2018-07-14 | Discharge: 2018-07-14 | Disposition: A | Discharge status: Home / Self Care | Payer: Self-pay | Attending: Emergency Medicine | Admitting: Emergency Medicine

## 2018-07-14 DIAGNOSIS — L0390 Cellulitis, unspecified: Secondary | ICD-10-CM

## 2018-07-14 LAB — I-STAT CHEM 8, ED
BUN: 5 mg/dL — AB (ref 6–20)
CHLORIDE: 110 mmol/L (ref 98–111)
Calcium, Ion: 1.09 mmol/L — ABNORMAL LOW (ref 1.15–1.40)
Creatinine, Ser: 0.8 mg/dL (ref 0.44–1.00)
Glucose, Bld: 98 mg/dL (ref 70–99)
HEMATOCRIT: 38 % (ref 36.0–46.0)
Hemoglobin: 12.9 g/dL (ref 12.0–15.0)
Potassium: 3.8 mmol/L (ref 3.5–5.1)
SODIUM: 143 mmol/L (ref 135–145)
TCO2: 27 mmol/L (ref 22–32)

## 2018-07-14 MED ORDER — BACITRACIN ZINC 500 UNIT/GM EX OINT: 1.0000 "application " | TOPICAL_OINTMENT | Freq: Two times a day (BID) | CUTANEOUS | 0 refills | Status: DC

## 2018-07-14 MED ORDER — BACITRACIN ZINC 500 UNIT/GM EX OINT
TOPICAL_OINTMENT | Freq: Two times a day (BID) | CUTANEOUS | Status: DC
  Administered 2018-07-14: 1 via TOPICAL

## 2018-07-14 MED ORDER — CEPHALEXIN 500 MG PO CAPS: 500.0000 mg | ORAL_CAPSULE | Freq: Four times a day (QID) | ORAL | 0 refills | Status: DC

## 2018-07-14 NOTE — Discharge Instructions (Signed)
Re-signed the ER for your swelling and pain over the wrist.  It appears that you have cellulitis. Please apply topical antibiotics for the next 2 days, if your symptoms are getting worse only then start taking the oral antibiotics.  Your lab work showed that your creatinine, which is kidney enzyme is normal. Urine analysis was also normal.

## 2018-07-14 NOTE — ED Notes (Signed)
Pt verbalized discharge instructions and follow up care.   

## 2018-07-14 NOTE — ED Provider Notes (Signed)
[facility]-EMERGENCY DEPT Provider Note   CSN: [id number] Arrival date & time: 07/13/18  2126     History   Chief Complaint Chief Complaint  Patient presents with  . Flank Pain  . Insect Bite    HPI Dominique Perry is a 32 y.o. female.  HPI 32 year old female comes in with chief complaint of flank pain and insect bite. She has history of pyelonephritis and kidney stones.  Her main complaint for coming to the ER is insect bite.  Patient reports that she noticed a lesion over her right forearm few days ago.  Over time the lesion became larger and developed into a whitehead.  Patient squeezed some pus out of it and noted that the swelling and redness has increased therefore she decided to come to the ER.  Patient denies any associated nausea, vomiting, fevers, chills.  Patient states that she also has been having flank pain and blood in the urine over the past 2 months intermittently.  Patient does not have insurance until January, therefore she has been holding off that complaint, however and so she is in the ED she was wondering if he can address it.  At the moment patient does not have any urinary frequency, pain with urination.  The blood in the urine is intermittent and typically present first thing in the morning.  She denies any vaginal discharge, bleeding and is not at risk for having STDs.  She denies any clinically significant unexplained weight loss or night sweats.  Patient does not know of any pelvic pathology.  Past Medical History:  Diagnosis Date  . Anemia   . Pyelonephritis   . Ureteral stone with hydronephrosis     Patient Active Problem List   Diagnosis Date Noted  . Hypokalemia 09/13/2012  . Normocytic anemia 09/13/2012  . Pyelonephritis 09/12/2012  . Hydronephrosis 09/12/2012  . Tobacco abuse 09/12/2012    Past Surgical History:  Procedure Laterality Date  . CYSTOSCOPY WITH STENT PLACEMENT Right 09/12/2012   Procedure: CYSTOSCOPY  WITH STENT PLACEMENT;  Surgeon: Valetta Fuller, MD;  Location: [facility] ORS;  Service: Urology;  Laterality: Right;  . CYSTOSCOPY/RETROGRADE/URETEROSCOPY/STONE EXTRACTION WITH BASKET Right 09/26/2012   Procedure: RIGHT URETEROSCOPY/STONE EXTRACTION WITH STENT;  Surgeon: Anner Crete, MD;  Location: [facility];  Service: Urology;  Laterality: Right;  . HOLMIUM LASER APPLICATION Right 09/26/2012   Procedure: HOLMIUM LASER APPLICATION;  Surgeon: Anner Crete, MD;  Location: [facility];  Service: Urology;  Laterality: Right;  . OPEN REDUCTION INTERNAL FIXATION (ORIF) DISTAL RADIAL FRACTURE Right 07/23/2015   Procedure: OPEN REDUCTION INTERNAL FIXATION (ORIF) DISTAL RADIAL FRACTURE;  Surgeon: Bradly Bienenstock, MD;  Location: [facility] OR;  Service: Orthopedics;  Laterality: Right;  . TUBAL LIGATION    . WRIST SURGERY       OB History   No obstetric history on file.      Home Medications    Prior to Admission medications   Medication Sig Start Date End Date Taking? Authorizing Provider  albuterol (PROVENTIL HFA) 108 (90 Base) MCG/ACT inhaler Inhale 2 puffs into the lungs every 6 (six) hours as needed for up to 5 days for wheezing or shortness of breath. 08/10/17 08/15/17  Benay Pike, NP  Aspirin-Salicylamide-Caffeine (BC HEADACHE POWDER PO) Take 1 each by mouth as needed (pain).    [provider]  bacitracin ointment Apply 1 application topically 2 (two) times daily. 07/14/18   Derwood Kaplan, MD  cephALEXin (KEFLEX) 500 MG capsule Take 1  capsule (500 mg total) by mouth 4 (four) times daily. 07/14/18   Derwood KaplanNanavati, Felicite Zeimet, MD  ondansetron (ZOFRAN ODT) 8 MG disintegrating tablet Take 1 tablet (8 mg total) by mouth every 8 (eight) hours as needed for nausea or vomiting. Patient not taking: Reported on 02/18/2016 12/02/14   Marisa Severintter, Olga, MD  promethazine (PHENERGAN) 25 MG tablet Take 1 tablet (25 mg total) by mouth every 6 (six) hours as needed for nausea. Patient not taking: Reported on 02/18/2016 12/02/14    Marisa Severintter, Olga, MD    Family History Family History  Problem Relation Age of Onset  . Hypertension Father   . Lung cancer Father     Social History Social History   Tobacco Use  . Smoking status: Current Every Day Smoker    Packs/day: 0.25    Years: 11.00    Pack years: 2.75    Types: Cigarettes  . Smokeless tobacco: Never Used  Substance Use Topics  . Alcohol use: Yes    Comment: OCCAS  . Drug use: Yes    Types: Marijuana     Allergies   Vicodin [hydrocodone-acetaminophen]   Review of Systems Review of Systems  Constitutional: Negative for fever.  Genitourinary: Positive for flank pain and hematuria.  Skin: Positive for rash.  Allergic/Immunologic: Negative for immunocompromised state.  Hematological: Does not bruise/bleed easily.     Physical Exam Updated Vital Signs BP 94/66 (BP Location: Left Arm)   Pulse 77   Temp 98 F (36.7 C) (Oral)   Resp 16   Ht 5\' 7"  (1.702 m)   Wt 77.1 kg   SpO2 99%   BMI 26.63 kg/m   Physical Exam Vitals signs and nursing note reviewed.  Constitutional:      Appearance: She is well-developed.  HENT:     Head: Normocephalic and atraumatic.  Neck:     Musculoskeletal: Normal range of motion and neck supple.  Cardiovascular:     Rate and Rhythm: Normal rate.  Pulmonary:     Effort: Pulmonary effort is normal.  Abdominal:     General: Bowel sounds are normal.     Tenderness: There is no abdominal tenderness.  Skin:    General: Skin is warm and dry.     Findings: Rash present.     Comments: Patient has a lesion over the right forearm distally with surrounding erythema.  There is no purulence  Neurological:     Mental Status: She is alert and oriented to person, place, and time.      ED Treatments / Results  Labs (all labs ordered are listed, but only abnormal results are displayed) Labs Reviewed  URINALYSIS, ROUTINE W REFLEX MICROSCOPIC - Abnormal; Notable for the following components:      Result Value    Color, Urine STRAW (*)    Specific Gravity, Urine 1.004 (*)    Hgb urine dipstick MODERATE (*)    All other components within normal limits  I-STAT CHEM 8, ED - Abnormal; Notable for the following components:   BUN 5 (*)    Calcium, Ion 1.09 (*)    All other components within normal limits  PREGNANCY, URINE    EKG None  Radiology No results found.  Procedures Procedures (including critical care time)  Medications Ordered in ED Medications  bacitracin ointment (1 application Topical Given 07/14/18 0127)     Initial Impression / Assessment and Plan / ED Course  I have reviewed the triage vital signs and the nursing notes.  Pertinent labs &  imaging results that were available during my care of the patient were reviewed by me and considered in my medical decision making (see chart for details).     32 year old comes in with chief complaint of pain and redness to her right distal forearm.  It appears that she was bit by an insect, had localized infection which might now be turning into cellulitis.  She will be given topical bacitracin by her and she will start taking antibiotics if the rash is spreading. As far as her symptoms of intermittent dysuria and flank pain, we checked her creatinine which is normal.  There is no indication for further work-up.  Her UA is also normal.  I have advised her to follow-up with a primary care doctor as soon as she gets her insurance for further work-up.  Final Clinical Impressions(s) / ED Diagnoses   Final diagnoses:  Cellulitis, unspecified cellulitis site    ED Discharge Orders         Ordered    cephALEXin (KEFLEX) 500 MG capsule  4 times daily     07/14/18 0149    bacitracin ointment  2 times daily     07/14/18 0150           Derwood Kaplan, MD 07/14/18 [phone]

## 2018-10-12 ENCOUNTER — Encounter (HOSPITAL_COMMUNITY): Payer: Self-pay | Admitting: Emergency Medicine

## 2018-10-12 ENCOUNTER — Other Ambulatory Visit: Payer: Self-pay

## 2018-10-12 ENCOUNTER — Ambulatory Visit (HOSPITAL_COMMUNITY)
Admission: EM | Admit: 2018-10-12 | Discharge: 2018-10-12 | Disposition: A | Discharge status: Home / Self Care | Payer: Managed Care, Other (non HMO) | Attending: Family Medicine | Admitting: Family Medicine

## 2018-10-12 DIAGNOSIS — M545 Low back pain, unspecified: Secondary | ICD-10-CM

## 2018-10-12 LAB — POCT URINALYSIS DIP (DEVICE)
Bilirubin Urine: NEGATIVE
Glucose, UA: NEGATIVE mg/dL
HGB URINE DIPSTICK: NEGATIVE
Ketones, ur: NEGATIVE mg/dL
Nitrite: NEGATIVE
PH: 5.5 (ref 5.0–8.0)
PROTEIN: NEGATIVE mg/dL
SPECIFIC GRAVITY, URINE: 1.025 (ref 1.005–1.030)
Urobilinogen, UA: 0.2 mg/dL (ref 0.0–1.0)

## 2018-10-12 LAB — COMPREHENSIVE METABOLIC PANEL
ALBUMIN: 3.4 g/dL — AB (ref 3.5–5.0)
ALT: 21 U/L (ref 0–44)
AST: 16 U/L (ref 15–41)
Alkaline Phosphatase: 61 U/L (ref 38–126)
Anion gap: 6 (ref 5–15)
BILIRUBIN TOTAL: 0.5 mg/dL (ref 0.3–1.2)
BUN: 12 mg/dL (ref 6–20)
CO2: 24 mmol/L (ref 22–32)
Calcium: 9 mg/dL (ref 8.9–10.3)
Chloride: 106 mmol/L (ref 98–111)
Creatinine, Ser: 0.75 mg/dL (ref 0.44–1.00)
GFR calc Af Amer: >60 mL/min (ref >60)
GFR calc non Af Amer: >60 mL/min (ref >60)
GLUCOSE: 93 mg/dL (ref 70–99)
Potassium: 3.7 mmol/L (ref 3.5–5.1)
SODIUM: 136 mmol/L (ref 135–145)
TOTAL PROTEIN: 6.5 g/dL (ref 6.5–8.1)

## 2018-10-12 NOTE — ED Notes (Signed)
Patient able to ambulate independently  

## 2018-10-12 NOTE — Discharge Instructions (Signed)
Please try heat and massage over the area  Please try ibuprofen or tylenol for the pain  We will call you with the results from today  Please see [place] back if your symptoms fail to improve.

## 2018-10-12 NOTE — ED Triage Notes (Signed)
Pt reports right sided pain around her kidney, urinary frequency and fatigue since yesterday.

## 2018-10-12 NOTE — ED Provider Notes (Signed)
[facility]    CSN: [id number] Arrival date & time: 10/12/18  1843     History   Chief Complaint Chief Complaint  Patient presents with  . Back Pain    right  . Urinary Frequency    HPI Dominique Perry is a 33 y.o. female.   She is presenting with right lower sided low back pain.  Symptoms started 2 days ago.  She has been working nonstop at work.  She has a history of nephrolithiasis and this is felt like previous exacerbations.  Denies any hematuria.  Has not taken anything for the pain.  Feels like the symptoms are worse when she voids.  Denies any radiation to the groin.  Denies any trauma or inciting event.  No suggestion of an STD.  HPI  Past Medical History:  Diagnosis Date  . Anemia   . Pyelonephritis   . Ureteral stone with hydronephrosis     Patient Active Problem List   Diagnosis Date Noted  . Hypokalemia 09/13/2012  . Normocytic anemia 09/13/2012  . Pyelonephritis 09/12/2012  . Hydronephrosis 09/12/2012  . Tobacco abuse 09/12/2012    Past Surgical History:  Procedure Laterality Date  . CYSTOSCOPY WITH STENT PLACEMENT Right 09/12/2012   Procedure: CYSTOSCOPY WITH STENT PLACEMENT;  Surgeon: Valetta Fuller, MD;  Location: [facility];  Service: Urology;  Laterality: Right;  . CYSTOSCOPY/RETROGRADE/URETEROSCOPY/STONE EXTRACTION WITH BASKET Right 09/26/2012   Procedure: RIGHT URETEROSCOPY/STONE EXTRACTION WITH STENT;  Surgeon: Anner Crete, MD;  Location: [facility];  Service: Urology;  Laterality: Right;  . HOLMIUM LASER APPLICATION Right 09/26/2012   Procedure: HOLMIUM LASER APPLICATION;  Surgeon: Anner Crete, MD;  Location: [facility];  Service: Urology;  Laterality: Right;  . OPEN REDUCTION INTERNAL FIXATION (ORIF) DISTAL RADIAL FRACTURE Right 07/23/2015   Procedure: OPEN REDUCTION INTERNAL FIXATION (ORIF) DISTAL RADIAL FRACTURE;  Surgeon: Bradly Bienenstock, MD;  Location: [facility];  Service: Orthopedics;  Laterality: Right;  . TUBAL LIGATION    . WRIST SURGERY       OB History   No obstetric history on file.      Home Medications    Prior to Admission medications   Medication Sig Start Date End Date Taking? Authorizing Provider  ADDERALL XR 15 MG 24 hr capsule TK 1 C PO ONCE D IN THE MORNING 10/04/18  Yes [provider]  ADDERALL XR 20 MG 24 hr capsule TK 1 C PO ONCE D IN THE AFTERNOON 10/04/18  Yes [provider]  albuterol (PROVENTIL HFA) 108 (90 Base) MCG/ACT inhaler Inhale 2 puffs into the lungs every 6 (six) hours as needed for up to 5 days for wheezing or shortness of breath. 08/10/17 08/15/17  Benay Pike, NP  Aspirin-Salicylamide-Caffeine (BC HEADACHE POWDER PO) Take 1 each by mouth as needed (pain).    [provider]  bacitracin ointment Apply 1 application topically 2 (two) times daily. 07/14/18   Derwood Kaplan, MD  cephALEXin (KEFLEX) 500 MG capsule Take 1 capsule (500 mg total) by mouth 4 (four) times daily. 07/14/18   Derwood Kaplan, MD  ondansetron (ZOFRAN ODT) 8 MG disintegrating tablet Take 1 tablet (8 mg total) by mouth every 8 (eight) hours as needed for nausea or vomiting. Patient not taking: Reported on 02/18/2016 12/02/14   Marisa Severin, MD  promethazine (PHENERGAN) 25 MG tablet Take 1 tablet (25 mg total) by mouth every 6 (six) hours as needed for nausea. Patient not taking: Reported on 02/18/2016 12/02/14   Norlene Campbell,  Marian Sorrow, MD    Family History Family History  Problem Relation Age of Onset  . Hypertension Father   . Lung cancer Father     Social History Social History   Tobacco Use  . Smoking status: Current Every Day Smoker    Packs/day: 0.25    Years: 11.00    Pack years: 2.75    Types: Cigarettes  . Smokeless tobacco: Never Used  Substance Use Topics  . Alcohol use: Yes    Comment: OCCAS  . Drug use: Yes    Types: Marijuana     Allergies   Vicodin [hydrocodone-acetaminophen]   Review of Systems Review of Systems  Constitutional: Negative for fever.  HENT: Negative for  congestion.   Respiratory: Negative for cough.   Cardiovascular: Negative for chest pain.  Gastrointestinal: Negative for abdominal pain.  Genitourinary: Positive for frequency.  Musculoskeletal: Positive for back pain.  Neurological: Negative for weakness.  Hematological: Negative for adenopathy.  Psychiatric/Behavioral: Negative for agitation.     Physical Exam Triage Vital Signs ED Triage Vitals  Enc Vitals Group     BP 10/12/18 1938 124/83     Pulse Rate 10/12/18 1938 73     Resp 10/12/18 1938 12     Temp 10/12/18 1938 98.8 F (37.1 C)     Temp Source 10/12/18 1938 Oral     SpO2 10/12/18 1938 98 %     Weight --      Height --      Head Circumference --      Peak Flow --      Pain Score 10/12/18 1939 5     Pain Loc --      Pain Edu? --      Excl. in GC? --    No data found.  Updated Vital Signs BP 124/83 (BP Location: Right Arm)   Pulse 73   Temp 98.8 F (37.1 C) (Oral)   Resp 12   LMP 10/08/2018 (Exact Date)   SpO2 98%   Visual Acuity Right Eye Distance:   Left Eye Distance:   Bilateral Distance:    Right Eye Near:   Left Eye Near:    Bilateral Near:     Physical Exam Gen: NAD, alert, cooperative with exam, well-appearing ENT: normal lips, normal nasal mucosa,  Eye: normal EOM, normal conjunctiva and lids CV:  no edema, +2 pedal pulses   Resp: no accessory muscle use, non-labored,  GI: no masses or tenderness, no hernia  Skin: no rashes, no areas of induration  Neuro: normal tone, normal sensation to touch Psych:  normal insight, alert and oriented MSK:  No CVA tenderness, tenderness to palpation of the right lower lumbar paraspinal muscle. No midline tenderness.  Neurovascular intact   UC Treatments / Results  Labs (all labs ordered are listed, but only abnormal results are displayed) Labs Reviewed  POCT URINALYSIS DIP (DEVICE) - Abnormal; Notable for the following components:      Result Value   Leukocytes,Ua TRACE (*)    All other  components within normal limits  URINE CULTURE  COMPREHENSIVE METABOLIC PANEL    EKG None  Radiology No results found.  Procedures Procedures (including critical care time)  Medications Ordered in UC Medications - No data to display  Initial Impression / Assessment and Plan / UC Course  I have reviewed the triage vital signs and the nursing notes.  Pertinent labs & imaging results that were available during my care of the patient were reviewed by  me and considered in my medical decision making (see chart for details).     Cheron is a 33 year old female is presenting with right-sided lumbar lower back pain over the paraspinal muscles.  She has concern of nephrolithiasis and infection.  Does not appear to be kidney stones today with urinalysis that does not demonstrate any blood.  Possible for an MSK origin.  Collected a metabolic panel as well as a urine culture.  Counseled on supportive care.  Given indications to follow-up and return.  Final Clinical Impressions(s) / UC Diagnoses   Final diagnoses:  Acute right-sided low back pain without sciatica     Discharge Instructions     Please try heat and massage over the area  Please try ibuprofen or tylenol for the pain  We will call you with the results from today  Please see [place] back if your symptoms fail to improve.     ED Prescriptions    None     Controlled Substance Prescriptions [place] Controlled Substance Registry consulted? Not Applicable   Myra Rude, MD 10/12/18 2039

## 2018-10-15 LAB — URINE CULTURE

## 2018-10-20 ENCOUNTER — Encounter: Payer: Self-pay | Admitting: Family Medicine

## 2018-10-20 ENCOUNTER — Ambulatory Visit: Payer: Self-pay | Admitting: Family Medicine

## 2018-10-20 VITALS — BP 110/80 | HR 96 | Temp 98.4°F | Resp 14 | Wt 177.8 lb

## 2018-10-20 DIAGNOSIS — J029 Acute pharyngitis, unspecified: Secondary | ICD-10-CM

## 2018-10-20 DIAGNOSIS — R05 Cough: Secondary | ICD-10-CM

## 2018-10-20 DIAGNOSIS — J309 Allergic rhinitis, unspecified: Secondary | ICD-10-CM

## 2018-10-20 DIAGNOSIS — R059 Cough, unspecified: Secondary | ICD-10-CM

## 2018-10-20 DIAGNOSIS — J069 Acute upper respiratory infection, unspecified: Secondary | ICD-10-CM

## 2018-10-20 LAB — POCT INFLUENZA A/B
Influenza A, POC: NEGATIVE
Influenza B, POC: NEGATIVE

## 2018-10-20 LAB — POCT RAPID STREP A (OFFICE): RAPID STREP A SCREEN: NEGATIVE

## 2018-10-20 MED ORDER — CETIRIZINE HCL 10 MG PO TABS: 10.0000 mg | ORAL_TABLET | Freq: Every day | ORAL | 0 refills | Status: DC

## 2018-10-20 MED ORDER — FLUTICASONE PROPIONATE 50 MCG/ACT NA SUSP: 2.0000 | Freq: Every day | NASAL | 0 refills | Status: DC

## 2018-10-20 MED ORDER — ALBUTEROL SULFATE HFA 108 (90 BASE) MCG/ACT IN AERS
2.0000 | INHALATION_SPRAY | Freq: Four times a day (QID) | RESPIRATORY_TRACT | 0 refills | Status: DC | PRN
Start: 2018-10-20 — End: 2020-04-14

## 2018-10-20 NOTE — Progress Notes (Signed)
Dominique Perry is a 33 y.o. female who presents today with 2-3 days of symptoms of sore throat, intermittent body aches, and cough for the last few days. She does report recent travel to [place] on 3/6-03/2019 but reports that she is not aware of any known sick contacts related to the Covid-19 outbreak. Dominique Perry has not attempted any treatments related to this condition up to this point. She became concerned this morning when she reports that her sore throat was worse than previous days and that when looking in the back of her throat saw some white patches. She is otherwise healthy and not under care for any chronic conditions she is a smoker of cigarettes.   Review of Systems  Constitutional: Negative for chills, fever and malaise/fatigue.  HENT: Positive for sore throat. Negative for congestion, ear discharge, ear pain and sinus pain.   Eyes: Negative.   Respiratory: Positive for cough. Negative for sputum production and shortness of breath.   Cardiovascular: Negative.  Negative for chest pain.  Gastrointestinal: Negative for abdominal pain, diarrhea, heartburn, nausea and vomiting.  Genitourinary: Negative for dysuria, frequency, hematuria and urgency.  Musculoskeletal: Positive for myalgias.  Skin: Negative.   Neurological: Negative for dizziness and headaches.  Endo/Heme/Allergies: Negative.   Psychiatric/Behavioral: Negative.     Dominique Perry has a current medication list which includes the following prescription(s): adderall xr, adderall xr, albuterol, aspirin-salicylamide-caffeine, bacitracin, cephalexin, ondansetron, and promethazine. Also is allergic to vicodin [hydrocodone-acetaminophen].  Dominique Perry  has a past medical history of Anemia, Pyelonephritis, and Ureteral stone with hydronephrosis. Also  has a past surgical history that includes Tubal ligation; Wrist surgery; Cystoscopy with stent placement (Right, 09/12/2012); Cystoscopy/retrograde/ureteroscopy/stone extraction with basket (Right,  09/26/2012); Holmium laser application (Right, 09/26/2012); and Open reduction internal fixation (orif) distal radial fracture (Right, 07/23/2015).    O: Vitals:   10/20/18 0945  BP: 110/80  Pulse: 96  Resp: 14  Temp: 98.4 F (36.9 C)  SpO2: 100%     Physical Exam Vitals signs reviewed.  Constitutional:      General: She is not in acute distress.    Appearance: Normal appearance. She is well-developed and normal weight. She is not ill-appearing, toxic-appearing or diaphoretic.  HENT:     Head: Normocephalic.     Right Ear: Hearing, tympanic membrane, ear canal and external ear normal. No middle ear effusion.     Left Ear: Hearing, ear canal and external ear normal. A middle ear effusion is present.     Nose: Rhinorrhea present. No congestion.     Right Sinus: No maxillary sinus tenderness or frontal sinus tenderness.     Left Sinus: No maxillary sinus tenderness or frontal sinus tenderness.     Mouth/Throat:     Lips: Pink.     Mouth: Mucous membranes are moist.     Pharynx: Uvula midline. Oropharyngeal exudate and posterior oropharyngeal erythema present. No pharyngeal swelling or uvula swelling.     Tonsils: Tonsillar exudate present. No tonsillar abscesses. 2+ on the right. 2+ on the left.  Neck:     Musculoskeletal: Normal range of motion and neck supple.  Cardiovascular:     Rate and Rhythm: Normal rate and regular rhythm.     Pulses: Normal pulses.     Heart sounds: Normal heart sounds.  Pulmonary:     Effort: Pulmonary effort is normal.     Breath sounds: Normal breath sounds. No decreased breath sounds, wheezing, rhonchi or rales.     Comments: [place] dry cough observed  on exam- not intractable-no retractions non productive- no post cough SOB- lungs CTAB-  Abdominal:     General: Bowel sounds are normal.     Palpations: Abdomen is soft.  Musculoskeletal: Normal range of motion.  Lymphadenopathy:     Head:     Right side of head: No submental, submandibular or  tonsillar adenopathy.     Left side of head: No submental, submandibular or tonsillar adenopathy.     Cervical: No cervical adenopathy.     Right cervical: No superficial cervical adenopathy.    Left cervical: No superficial cervical adenopathy.  Neurological:     Mental Status: She is alert and oriented to person, place, and time.    A: 1. Upper respiratory tract infection, unspecified type   2. Allergic rhinitis, unspecified seasonality, unspecified trigger   3. Sore throat   4. Cough     P: 1. Upper respiratory tract infection, unspecified type Suspect viral URI- supportive care and patient advised to continue to monitor and treat with OTC cough/cold medication of her choice.  2. Allergic rhinitis, unspecified seasonality, unspecified trigger Suspect some contributing factors or allergies due to known seasonal inlfux of pollen and patinet new onset of symptoms with mild ear effusion, sore thraot, nasal erythema and hx of allergies that are currently untreated.   3. Sore throat  Supportive care, warm liquids, throat lozenges, warm salt water rinses - POCT Influenza A/B - POCT rapid strep A Results for orders placed or performed in visit on 10/20/18 (from the past 24 hour(s))  POCT Influenza A/B     Status: Normal   Collection Time: 10/20/18 10:02 AM  Result Value Ref Range   Influenza A, POC Negative Negative   Influenza B, POC Negative Negative  POCT rapid strep A     Status: Normal   Collection Time: 10/20/18 10:02 AM  Result Value Ref Range   Rapid Strep A Screen Negative Negative   4. Cough OTC supportive care medication of choice- Work note x 24 hours per patient request- explained that condition is likely viral and self-limited  Discussed with patient exam findings, suspected diagnosis etiology and  reviewed recommended treatment plan and follow up, including complications and indications for urgent medical follow up and evaluation. Medications including use and  indications reviewed with patient. Patient provided relevant patient education on diagnosis and/or relevant related condition that were discussed and reviewed with patient at discharge. Patient verbalized understanding of information provided and agrees with plan of care (POC), all questions answered.

## 2018-10-20 NOTE — Patient Instructions (Addendum)
Work note x 24 hours Use medication for allergies and avoid trigger or outside/environment exposure to decrease symptoms Use OTC cough medication of choice and supportive care for sore throat  Upper Respiratory Infection, Adult An upper respiratory infection (URI) affects the nose, throat, and upper air passages. URIs are caused by germs (viruses). The most common type of URI is often called "the common cold." Medicines cannot cure URIs, but you can do things at home to relieve your symptoms. URIs usually get better within 7-10 days. Follow these instructions at home: Activity  Rest as needed.  If you have a fever, stay home from work or school until your fever is gone, or until your doctor says you may return to work or school. ? You should stay home until you cannot spread the infection anymore (you are not contagious). ? Your doctor may have you wear a face mask so you have less risk of spreading the infection. Relieving symptoms  Gargle with a salt-water mixture 3-4 times a day or as needed. To make a salt-water mixture, completely dissolve -1 tsp of salt in 1 cup of warm water.  Use a cool-mist humidifier to add moisture to the air. This can help you breathe more easily. Eating and drinking   Drink enough fluid to keep your pee (urine) pale yellow.  Eat soups and other clear broths. General instructions   Take over-the-counter and prescription medicines only as told by your doctor. These include cold medicines, fever reducers, and cough suppressants.  Do not use any products that contain nicotine or tobacco. These include cigarettes and e-cigarettes. If you need help quitting, ask your doctor.  Avoid being where people are smoking (avoid secondhand smoke).  Make sure you get regular shots and get the flu shot every year.  Keep all follow-up visits as told by your doctor. This is important. How to avoid spreading infection to others   Wash your hands often with soap and  water. If you do not have soap and water, use hand sanitizer.  Avoid touching your mouth, face, eyes, or nose.  Cough or sneeze into a tissue or your sleeve or elbow. Do not cough or sneeze into your hand or into the air. Contact a doctor if:  You are getting worse, not better.  You have any of these: ? A fever. ? Chills. ? Brown or red mucus in your nose. ? Yellow or brown fluid (discharge)coming from your nose. ? Pain in your face, especially when you bend forward. ? Swollen neck glands. ? Pain with swallowing. ? White areas in the back of your throat. Get help right away if:  You have shortness of breath that gets worse.  You have very bad or constant: ? Headache. ? Ear pain. ? Pain in your forehead, behind your eyes, and over your cheekbones (sinus pain). ? Chest pain.  You have long-lasting (chronic) lung disease along with any of these: ? Wheezing. ? Long-lasting cough. ? Coughing up blood. ? A change in your usual mucus.  You have a stiff neck.  You have changes in your: ? Vision. ? Hearing. ? Thinking. ? Mood. Summary  An upper respiratory infection (URI) is caused by a germ called a virus. The most common type of URI is often called "the common cold."  URIs usually get better within 7-10 days.  Take over-the-counter and prescription medicines only as told by your doctor. This information is not intended to replace advice given to you by your health care  provider. Make sure you discuss any questions you have with your health care provider. Document Released: 01/05/2008 Document Revised: 03/11/2017 Document Reviewed: 03/11/2017 Elsevier Interactive Patient Education  2019 [employer] Allergic Rhinitis, Adult Allergic rhinitis is an allergic reaction that affects the mucous membrane inside the nose. It causes sneezing, a runny or stuffy nose, and the feeling of mucus going down the back of the throat (postnasal drip). Allergic rhinitis can be mild to  severe. There are two types of allergic rhinitis:  Seasonal. This type is also called hay fever. It happens only during certain seasons.  Perennial. This type can happen at any time of the year. What are the causes? This condition happens when the body's defense system (immune system) responds to certain harmless substances called allergens as though they were germs.  Seasonal allergic rhinitis is triggered by pollen, which can come from grasses, trees, and weeds. Perennial allergic rhinitis may be caused by:  House dust mites.  Pet dander.  Mold spores. What are the signs or symptoms? Symptoms of this condition include:  Sneezing.  Runny or stuffy nose (nasal congestion).  Postnasal drip.  Itchy nose.  Tearing of the eyes.  Trouble sleeping.  Daytime sleepiness. How is this diagnosed? This condition may be diagnosed based on:  Your medical history.  A physical exam.  Tests to check for related conditions, such as: ? Asthma. ? Pink eye. ? Ear infection. ? Upper respiratory infection.  Tests to find out which allergens trigger your symptoms. These may include skin or blood tests. How is this treated? There is no cure for this condition, but treatment can help control symptoms. Treatment may include:  Taking medicines that block allergy symptoms, such as antihistamines. Medicine may be given as a shot, nasal spray, or pill.  Avoiding the allergen.  Desensitization. This treatment involves getting ongoing shots until your body becomes less sensitive to the allergen. This treatment may be done if other treatments do not help.  If taking medicine and avoiding the allergen does not work, new, stronger medicines may be prescribed. Follow these instructions at home:  Find out what you are allergic to. Common allergens include smoke, dust, and pollen.  Avoid the things you are allergic to. These are some things you can do to help avoid allergens: ? Replace carpet  with wood, tile, or vinyl flooring. Carpet can trap dander and dust. ? Do not smoke. Do not allow smoking in your home. ? Change your heating and air conditioning filter at least once a month. ? During allergy season:  Keep windows closed as much as possible.  Plan outdoor activities when pollen counts are lowest. This is usually during the evening hours.  When coming indoors, change clothing and shower before sitting on furniture or bedding.  Take over-the-counter and prescription medicines only as told by your health care provider.  Keep all follow-up visits as told by your health care provider. This is important. Contact a health care provider if:  You have a fever.  You develop a persistent cough.  You make whistling sounds when you breathe (you wheeze).  Your symptoms interfere with your normal daily activities. Get help right away if:  You have shortness of breath. Summary  This condition can be managed by taking medicines as directed and avoiding allergens.  Contact your health care provider if you develop a persistent cough or fever.  During allergy season, keep windows closed as much as possible. This information is not intended to replace advice  given to you by your health care provider. Make sure you discuss any questions you have with your health care provider. Document Released: 04/13/2001 Document Revised: 08/26/2016 Document Reviewed: 08/26/2016 Elsevier Interactive Patient Education  2019 [employer].

## 2018-10-23 ENCOUNTER — Telehealth: Payer: Self-pay | Admitting: Family Medicine

## 2018-10-23 ENCOUNTER — Telehealth: Payer: Self-pay

## 2018-10-23 NOTE — Telephone Encounter (Signed)
Patient returned the call, she states the sore throat and body aches improved, and she started having cough 2 days ago, the cough is worse at morning and night and she is using an over the counter  cough syrup, she can not remember the name or the ingredients of the medication. Patient also states she is not using the nasal spray prescribed by the provider.   I will call patient back after I speak with the provider to see if the patient needs to do something else.

## 2018-10-23 NOTE — Telephone Encounter (Signed)
Pt didn't answer, left voicemail.

## 2019-05-30 ENCOUNTER — Other Ambulatory Visit: Payer: Self-pay

## 2019-05-30 ENCOUNTER — Emergency Department (HOSPITAL_COMMUNITY)
Admission: EM | Admit: 2019-05-30 | Discharge: 2019-05-31 | Disposition: A | Discharge status: Home / Self Care | Payer: Managed Care, Other (non HMO) | Attending: Emergency Medicine | Admitting: Emergency Medicine

## 2019-05-30 ENCOUNTER — Emergency Department (HOSPITAL_COMMUNITY): Payer: Managed Care, Other (non HMO)

## 2019-05-30 DIAGNOSIS — Z79899 Other long term (current) drug therapy: Secondary | ICD-10-CM | POA: Diagnosis not present

## 2019-05-30 DIAGNOSIS — L720 Epidermal cyst: Secondary | ICD-10-CM

## 2019-05-30 DIAGNOSIS — R0789 Other chest pain: Secondary | ICD-10-CM | POA: Diagnosis present

## 2019-05-30 DIAGNOSIS — R05 Cough: Secondary | ICD-10-CM | POA: Insufficient documentation

## 2019-05-30 DIAGNOSIS — F17210 Nicotine dependence, cigarettes, uncomplicated: Secondary | ICD-10-CM | POA: Insufficient documentation

## 2019-05-30 LAB — CBC
HCT: 41.5 % (ref 36.0–46.0)
Hemoglobin: 14 g/dL (ref 12.0–15.0)
MCH: 31.4 pg (ref 26.0–34.0)
MCHC: 33.7 g/dL (ref 30.0–36.0)
MCV: 93 fL (ref 80.0–100.0)
Platelets: 337 10*3/uL (ref 150–400)
RBC: 4.46 MIL/uL (ref 3.87–5.11)
RDW: 12.1 % (ref 11.5–15.5)
WBC: 6.6 10*3/uL (ref 4.0–10.5)
nRBC: 0 % (ref 0.0–0.2)

## 2019-05-30 LAB — TROPONIN I (HIGH SENSITIVITY): Troponin I (High Sensitivity): <2 ng/L (ref <18)

## 2019-05-30 LAB — BASIC METABOLIC PANEL
Anion gap: 13 (ref 5–15)
BUN: 7 mg/dL (ref 6–20)
CO2: 18 mmol/L — ABNORMAL LOW (ref 22–32)
Calcium: 8.9 mg/dL (ref 8.9–10.3)
Chloride: 109 mmol/L (ref 98–111)
Creatinine, Ser: 0.7 mg/dL (ref 0.44–1.00)
GFR calc Af Amer: >60 mL/min (ref >60)
GFR calc non Af Amer: >60 mL/min (ref >60)
Glucose, Bld: 117 mg/dL — ABNORMAL HIGH (ref 70–99)
Potassium: 3.3 mmol/L — ABNORMAL LOW (ref 3.5–5.1)
Sodium: 140 mmol/L (ref 135–145)

## 2019-05-30 LAB — I-STAT BETA HCG BLOOD, ED (MC, WL, AP ONLY): I-stat hCG, quantitative: <5 m[IU]/mL (ref <5)

## 2019-05-30 MED ORDER — SODIUM CHLORIDE 0.9% FLUSH: 3.0000 mL | Freq: Once | INTRAVENOUS | Status: DC

## 2019-05-30 NOTE — ED Triage Notes (Signed)
Pt has had lump on anterior left side of chest x 3 months.  Onset 1 week now hard to take a deep breath and pt is short of breath.

## 2019-05-30 NOTE — ED Notes (Signed)
Pt called multiple times, no answer 

## 2019-05-31 ENCOUNTER — Other Ambulatory Visit: Payer: Self-pay

## 2019-05-31 LAB — TROPONIN I (HIGH SENSITIVITY): Troponin I (High Sensitivity): <2 ng/L (ref <18)

## 2019-05-31 NOTE — Discharge Instructions (Signed)
Apply warm compresses and alternate with ice to reduce swelling and pain Please follow up with Dermatology for cyst removal

## 2019-05-31 NOTE — ED Provider Notes (Signed)
[facility]ER[facility] DEPARTMENT Provider Note   CSN: [id number] Arrival date & time: 05/30/19  1755     History   Chief Complaint Chief Complaint  Patient presents with  . Chest Pain    HPI Dominique Perry is a 33 y.o. female who presents with a lump on her chest.  Patient states for approximately 2 to 3 months she has had a small lump on the left upper anterior chest wall.  It was small and painless when she first noticed it however recently has been getting larger and she notes it has been tender.  She has been squeezing the area.  Nothing makes it better.  Palpation of the area makes it worse.  She is never had this before.  She tried to see her PCP however they could not see her until December so she decided to come to the emergency department.  Patient reports cough for approximately 2 days.  She is a smoker.  No fever or chills. No chest pain or SOB or leg swelling.    HPI  Past Medical History:  Diagnosis Date  . Anemia   . Pyelonephritis   . Ureteral stone with hydronephrosis     Patient Active Problem List   Diagnosis Date Noted  . Hypokalemia 09/13/2012  . Normocytic anemia 09/13/2012  . Pyelonephritis 09/12/2012  . Hydronephrosis 09/12/2012  . Tobacco abuse 09/12/2012    Past Surgical History:  Procedure Laterality Date  . CYSTOSCOPY WITH STENT PLACEMENT Right 09/12/2012   Procedure: CYSTOSCOPY WITH STENT PLACEMENT;  Surgeon: Bernestine Amass, MD;  Location: [facility];  Service: Urology;  Laterality: Right;  . CYSTOSCOPY/RETROGRADE/URETEROSCOPY/STONE EXTRACTION WITH BASKET Right 09/26/2012   Procedure: RIGHT URETEROSCOPY/STONE EXTRACTION WITH STENT;  Surgeon: Malka So, MD;  Location: [facility];  Service: Urology;  Laterality: Right;  . HOLMIUM LASER APPLICATION Right 4/66/5993   Procedure: HOLMIUM LASER APPLICATION;  Surgeon: Malka So, MD;  Location: [facility];  Service: Urology;  Laterality: Right;  . OPEN REDUCTION INTERNAL FIXATION (ORIF)  DISTAL RADIAL FRACTURE Right 07/23/2015   Procedure: OPEN REDUCTION INTERNAL FIXATION (ORIF) DISTAL RADIAL FRACTURE;  Surgeon: Iran Planas, MD;  Location: [facility];  Service: Orthopedics;  Laterality: Right;  . TUBAL LIGATION    . WRIST SURGERY       OB History   No obstetric history on file.      Home Medications    Prior to Admission medications   Medication Sig Start Date End Date Taking? Authorizing Provider  ADDERALL XR 15 MG 24 hr capsule TK 1 C PO ONCE D IN THE MORNING 10/04/18   [provider]  ADDERALL XR 20 MG 24 hr capsule TK 1 C PO ONCE D IN THE AFTERNOON 10/04/18   [provider]  albuterol (PROVENTIL HFA) 108 (90 Base) MCG/ACT inhaler Inhale 2 puffs into the lungs every 6 (six) hours as needed for up to 5 days for wheezing or shortness of breath. 08/10/17 08/15/17  Kara Dies, NP  albuterol (PROVENTIL HFA;VENTOLIN HFA) 108 (90 Base) MCG/ACT inhaler Inhale 2 puffs into the lungs every 6 (six) hours as needed for wheezing or shortness of breath. 10/20/18   Shella Maxim, NP  Aspirin-Salicylamide-Caffeine (BC HEADACHE POWDER PO) Take 1 each by mouth as needed (pain).    [provider]  bacitracin ointment Apply 1 application topically 2 (two) times daily. Patient not taking: Reported on 10/20/2018 07/14/18   Varney Biles, MD  cephALEXin (KEFLEX) 500 MG capsule  Take 1 capsule (500 mg total) by mouth 4 (four) times daily. Patient not taking: Reported on 10/20/2018 07/14/18   Derwood KaplanNanavati, Ankit, MD  cetirizine (ZYRTEC) 10 MG tablet Take 1 tablet (10 mg total) by mouth daily. 10/20/18   Zachery DauerGraham, Elysa, NP  fluticasone (FLONASE) 50 MCG/ACT nasal spray Place 2 sprays into both nostrils daily. 10/20/18   Zachery DauerGraham, Elysa, NP  ondansetron (ZOFRAN ODT) 8 MG disintegrating tablet Take 1 tablet (8 mg total) by mouth every 8 (eight) hours as needed for nausea or vomiting. Patient not taking: Reported on 02/18/2016 12/02/14   Marisa Severintter, Olga, MD  promethazine (PHENERGAN)  25 MG tablet Take 1 tablet (25 mg total) by mouth every 6 (six) hours as needed for nausea. Patient not taking: Reported on 02/18/2016 12/02/14   Marisa Severintter, Olga, MD    Family History Family History  Problem Relation Age of Onset  . Hypertension Father   . Lung cancer Father     Social History Social History   Tobacco Use  . Smoking status: Current Every Day Smoker    Packs/day: 0.25    Years: 11.00    Pack years: 2.75    Types: Cigarettes  . Smokeless tobacco: Never Used  Substance Use Topics  . Alcohol use: Yes    Comment: OCCAS  . Drug use: Yes    Types: Marijuana     Allergies   Vicodin [hydrocodone-acetaminophen]   Review of Systems Review of Systems  Constitutional: Negative for fever.  Respiratory: Positive for cough.   Cardiovascular: Negative for chest pain.  Skin:       +lump     Physical Exam Updated Vital Signs BP 123/88 (BP Location: Left Arm)   Pulse 84   Temp 98.4 F (36.9 C) (Oral)   Resp 17   SpO2 99%   Physical Exam Vitals signs and nursing note reviewed.  Constitutional:      General: She is not in acute distress.    Appearance: She is well-developed. She is not ill-appearing.  HENT:     Head: Normocephalic and atraumatic.  Eyes:     General: No scleral icterus.       Right eye: No discharge.        Left eye: No discharge.     Conjunctiva/sclera: Conjunctivae normal.     Pupils: Pupils are equal, round, and reactive to light.  Neck:     Musculoskeletal: Normal range of motion.  Cardiovascular:     Rate and Rhythm: Normal rate and regular rhythm.  Pulmonary:     Effort: Pulmonary effort is normal. No respiratory distress.     Breath sounds: Normal breath sounds.  Abdominal:     General: There is no distension.  Skin:    General: Skin is warm and dry.     Comments: Firm nodule over the left upper anterior chest wall with overlying dilated pore and erythema. No fluctuance or drainage.  Neurological:     Mental Status: She is alert  and oriented to person, place, and time.  Psychiatric:        Behavior: Behavior normal.      ED Treatments / Results  Labs (all labs ordered are listed, but only abnormal results are displayed) Labs Reviewed  BASIC METABOLIC PANEL - Abnormal; Notable for the following components:      Result Value   Potassium 3.3 (*)    CO2 18 (*)    Glucose, Bld 117 (*)    All other components within normal limits  CBC  I-STAT BETA HCG BLOOD, ED ([facility], [facility], [facility] ONLY)  TROPONIN I (HIGH SENSITIVITY)  TROPONIN I (HIGH SENSITIVITY)    EKG EKG Interpretation  Date/Time:  Wednesday May 30 2019 18:05:28 EDT Ventricular Rate:  106 PR Interval:  144 QRS Duration: 88 QT Interval:  318 QTC Calculation: 422 R Axis:   89 Text Interpretation: Sinus tachycardia Otherwise normal ECG When compared with ECG of 07/23/2015, HEART RATE has increased Confirmed by Dione Booze ([id number]) on 05/30/2019 11:58:10 PM   Radiology Dg Chest 2 View  Result Date: 05/30/2019 CLINICAL DATA:  Chest pain EXAM: CHEST - 2 VIEW COMPARISON:  May 18, 2015 FINDINGS: The heart size and mediastinal contours are within normal limits. Both lungs are clear. The visualized skeletal structures are unremarkable. IMPRESSION: No active cardiopulmonary disease. Electronically Signed   By: Jonna Clark M.D.   On: 05/30/2019 19:25    Procedures Procedures (including critical care time)  Medications Ordered in ED Medications - No data to display   Initial Impression / Assessment and Plan / ED Course  I have reviewed the triage vital signs and the nursing notes.  Pertinent labs & imaging results that were available during my care of the patient were reviewed by me and considered in my medical decision making (see chart for details).  33 year old female presents with a skin nodule on the chest which has been present for months and is now painful. Exam is consistent with a epidermoid cyst. It is not fluctuant and at this time I do  not think I&D is prudent. She is not having chest pain or SOB. Vitals are normal. Chest pain work up was obtained in triage and is reassuring. Advised application of heat and ice. Will have her f/u with dermatology for cyst removal.  Final Clinical Impressions(s) / ED Diagnoses   Final diagnoses:  Epidermoid cyst of skin of chest    ED Discharge Orders    None       Bethel Born, PA-C 05/31/19 1025    Bethann Berkshire, MD 06/02/19 [id number]

## 2019-09-11 ENCOUNTER — Other Ambulatory Visit: Payer: Self-pay

## 2019-09-12 ENCOUNTER — Encounter: Payer: Self-pay | Admitting: Family Medicine

## 2019-09-12 ENCOUNTER — Ambulatory Visit (INDEPENDENT_AMBULATORY_CARE_PROVIDER_SITE_OTHER): Payer: Managed Care, Other (non HMO) | Admitting: Family Medicine

## 2019-09-12 VITALS — BP 140/100 | HR 99 | Temp 98.1°F | Ht 66.5 in | Wt 186.6 lb

## 2019-09-12 DIAGNOSIS — F339 Major depressive disorder, recurrent, unspecified: Secondary | ICD-10-CM | POA: Diagnosis not present

## 2019-09-12 DIAGNOSIS — Z23 Encounter for immunization: Secondary | ICD-10-CM

## 2019-09-12 DIAGNOSIS — F419 Anxiety disorder, unspecified: Secondary | ICD-10-CM

## 2019-09-12 MED ORDER — ESCITALOPRAM OXALATE 10 MG PO TABS: 10.0000 mg | ORAL_TABLET | Freq: Every day | ORAL | 0 refills | Status: DC

## 2019-09-12 NOTE — Progress Notes (Signed)
Dominique Perry is a 34 y.o. female  Chief Complaint  Patient presents with  . Establish Care    Pt is due for a pap smear but will scheduel to come back another day.  Pt c/o of fatigue x 10months.  Pt would like Dr. to look at bump on lt side of chest, it's been there 2 months, which it has gone down since Dec.    HPI: Dominique Perry is a 34 y.o. female here to establish care with our office. She has 2 concerns/issues today: 1. Cyst on Lt anterior chest since 04/2019. She was seen in ER in 05/2019 for it because it had increased in size and tenderness. She was referred to derm but was not able to go to the appt. It is much smaller now, not bothersome. Does not want to do anything about it if she does not have to.  2. Pt complains of feeling fatigued, down x 6+ mo.  She states she dealt with depression in her teens, was on zoloft. She had panic attack and thought it was due to zoloft so that was stopped.  She works for [employer] ([employer], etc) so that has been very stressful, busy. She is work 6-7 days per week.  She called out of work all of last week. She is feeling unmotivated, slower. Sleep is off - either too much or not enough. She is eating more. She is also more anxious, trouble relaxing, lots of worrying about nothing specific. She has 35yo and 14yo daughters.  Past Medical History:  Diagnosis Date  . Anemia   . Pyelonephritis   . Ureteral stone with hydronephrosis     Past Surgical History:  Procedure Laterality Date  . CYSTOSCOPY WITH STENT PLACEMENT Right 09/12/2012   Procedure: CYSTOSCOPY WITH STENT PLACEMENT;  Surgeon: Valetta Fuller, MD;  Location: [facility];  Service: Urology;  Laterality: Right;  . CYSTOSCOPY/RETROGRADE/URETEROSCOPY/STONE EXTRACTION WITH BASKET Right 09/26/2012   Procedure: RIGHT URETEROSCOPY/STONE EXTRACTION WITH STENT;  Surgeon: Anner Crete, MD;  Location: [facility];  Service: Urology;  Laterality: Right;  . HOLMIUM LASER APPLICATION Right 09/26/2012     Procedure: HOLMIUM LASER APPLICATION;  Surgeon: Anner Crete, MD;  Location: [facility];  Service: Urology;  Laterality: Right;  . OPEN REDUCTION INTERNAL FIXATION (ORIF) DISTAL RADIAL FRACTURE Right 07/23/2015   Procedure: OPEN REDUCTION INTERNAL FIXATION (ORIF) DISTAL RADIAL FRACTURE;  Surgeon: Bradly Bienenstock, MD;  Location: [facility];  Service: Orthopedics;  Laterality: Right;  . TUBAL LIGATION    . WRIST SURGERY      Social History   Socioeconomic History  . Marital status: Single    Spouse name: Not on file  . Number of children: Not on file  . Years of education: Not on file  . Highest education level: Not on file  Occupational History  . Not on file  Tobacco Use  . Smoking status: Current Every Day Smoker    Packs/day: 0.25    Years: 11.00    Pack years: 2.75    Types: Cigarettes  . Smokeless tobacco: Never Used  Substance and Sexual Activity  . Alcohol use: Yes    Comment: OCCAS  . Drug use: Yes    Types: Marijuana  . Sexual activity: Not on file  Other Topics Concern  . Not on file  Social History Narrative  . Not on file   Social Determinants of Health   Financial Resource Strain:   . Difficulty of Paying Living  Expenses: Not on file  Food Insecurity:   . Worried About Programme researcher, broadcasting/film/video in the Last Year: Not on file  . Ran Out of Food in the Last Year: Not on file  Transportation Needs:   . Lack of Transportation (Medical): Not on file  . Lack of Transportation (Non-Medical): Not on file  Physical Activity:   . Days of Exercise per Week: Not on file  . Minutes of Exercise per Session: Not on file  Stress:   . Feeling of Stress : Not on file  Social Connections:   . Frequency of Communication with Friends and Family: Not on file  . Frequency of Social Gatherings with Friends and Family: Not on file  . Attends Religious Services: Not on file  . Active Member of Clubs or Organizations: Not on file  . Attends Banker Meetings: Not on file  .  Marital Status: Not on file  Intimate Partner Violence:   . Fear of Current or Ex-Partner: Not on file  . Emotionally Abused: Not on file  . Physically Abused: Not on file  . Sexually Abused: Not on file    Family History  Problem Relation Age of Onset  . Hypertension Father   . Lung cancer Father      Immunization History  Administered Date(s) Administered  . Influenza Split 09/12/2012  . Pneumococcal Polysaccharide-23 09/12/2012    Outpatient Encounter Medications as of 09/12/2019  Medication Sig  . ADDERALL XR 15 MG 24 hr capsule TK 1 C PO ONCE D IN THE MORNING  . ADDERALL XR 20 MG 24 hr capsule TK 1 C PO ONCE D IN THE AFTERNOON  . Aspirin-Salicylamide-Caffeine (BC HEADACHE POWDER PO) Take 1 each by mouth as needed (pain).  Marland Kitchen albuterol (PROVENTIL HFA) 108 (90 Base) MCG/ACT inhaler Inhale 2 puffs into the lungs every 6 (six) hours as needed for up to 5 days for wheezing or shortness of breath.  Marland Kitchen albuterol (PROVENTIL HFA;VENTOLIN HFA) 108 (90 Base) MCG/ACT inhaler Inhale 2 puffs into the lungs every 6 (six) hours as needed for wheezing or shortness of breath. (Patient not taking: Reported on 09/12/2019)  . cetirizine (ZYRTEC) 10 MG tablet Take 1 tablet (10 mg total) by mouth daily. (Patient not taking: Reported on 09/12/2019)  . [DISCONTINUED] bacitracin ointment Apply 1 application topically 2 (two) times daily. (Patient not taking: Reported on 10/20/2018)  . [DISCONTINUED] cephALEXin (KEFLEX) 500 MG capsule Take 1 capsule (500 mg total) by mouth 4 (four) times daily. (Patient not taking: Reported on 10/20/2018)  . [DISCONTINUED] fluticasone (FLONASE) 50 MCG/ACT nasal spray Place 2 sprays into both nostrils daily.  . [DISCONTINUED] ondansetron (ZOFRAN ODT) 8 MG disintegrating tablet Take 1 tablet (8 mg total) by mouth every 8 (eight) hours as needed for nausea or vomiting. (Patient not taking: Reported on 02/18/2016)  . [DISCONTINUED] promethazine (PHENERGAN) 25 MG tablet Take 1 tablet  (25 mg total) by mouth every 6 (six) hours as needed for nausea. (Patient not taking: Reported on 02/18/2016)   No facility-administered encounter medications on file as of 09/12/2019.     ROS: Pertinent positives and negatives noted in HPI. Remainder of ROS non-contributory   Allergies  Allergen Reactions  . Vicodin [Hydrocodone-Acetaminophen] Nausea Only  . Hydrocodone-Acetaminophen Nausea Only    BP (!) 140/100 (BP Location: Left Arm, Patient Position: Sitting, Cuff Size: Normal)   Pulse 99   Temp 98.1 F (36.7 C) (Temporal)   Ht 5' 6.5" (1.689 m)   Wt 186  lb 9.6 oz (84.6 kg)   LMP 08/31/2019   SpO2 97%   BMI 29.67 kg/m   BP Readings from Last 3 Encounters:  09/12/19 (!) 140/100  05/31/19 125/87  10/20/18 110/80   Wt Readings from Last 3 Encounters:  09/12/19 186 lb 9.6 oz (84.6 kg)  10/20/18 177 lb 12.8 oz (80.6 kg)  07/13/18 170 lb (77.1 kg)     Physical Exam  Constitutional: She is oriented to person, place, and time. She appears well-developed and well-nourished. No distress.  Cardiovascular: Normal rate.  Pulmonary/Chest: Effort normal. No respiratory distress.  Neurological: She is alert and oriented to person, place, and time. Coordination normal.  Psychiatric: Her speech is normal and behavior is normal. Her mood appears not anxious. She exhibits a depressed mood.     A/P:  1. Depression, recurrent ([place]) 2. Anxiety - PHQ-9 = 19, GAD-7 = 17 Rx: - escitalopram (LEXAPRO) 10 MG tablet; Take 1 tablet (10 mg total) by mouth daily.  Dispense: 90 tablet; Refill: 0 - stressed importance of healthy diet, regular exercise, adequate sleep  - f/u in 3-4 wks or sooner PRN  Discussed plan and reviewed medications with patient, including risks, benefits, and potential side effects. Pt expressed understand. All questions answered.   This visit occurred during the SARS-CoV-2 public health emergency.  Safety protocols were in place, including screening questions prior  to the visit, additional usage of staff PPE, and extensive cleaning of exam room while observing appropriate contact time as indicated for disinfecting solutions.

## 2019-09-12 NOTE — Patient Instructions (Signed)
Health Maintenance Due  Topic Date Due  . TETANUS/TDAP Will receive today. 10/08/2003  . PAP SMEAR-Modifier Will schedule pap smear at a later date. 10/07/2005    No flowsheet data found.

## 2019-10-12 ENCOUNTER — Ambulatory Visit: Payer: Managed Care, Other (non HMO) | Admitting: Family Medicine

## 2019-10-12 DIAGNOSIS — Z0289 Encounter for other administrative examinations: Secondary | ICD-10-CM

## 2019-12-04 ENCOUNTER — Other Ambulatory Visit: Payer: Self-pay | Admitting: Family Medicine

## 2019-12-04 DIAGNOSIS — F419 Anxiety disorder, unspecified: Secondary | ICD-10-CM

## 2019-12-04 DIAGNOSIS — F339 Major depressive disorder, recurrent, unspecified: Secondary | ICD-10-CM

## 2019-12-06 ENCOUNTER — Ambulatory Visit: Payer: Self-pay | Attending: Internal Medicine

## 2019-12-06 DIAGNOSIS — Z23 Encounter for immunization: Secondary | ICD-10-CM

## 2019-12-06 NOTE — Progress Notes (Signed)
   Covid-19 Vaccination Clinic  Name:  Dominique Perry    MRN: [id number] DOB: [birth year]  12/06/2019  Dominique Perry was observed post Covid-19 immunization for 15 minutes without incident. She was provided with Vaccine Information Sheet and instruction to access the V-Safe system.   Dominique Perry was instructed to call 911 with any severe reactions post vaccine: Marland Kitchen Difficulty breathing  . Swelling of face and throat  . A fast heartbeat  . A bad rash all over body  . Dizziness and weakness   Immunizations Administered    Name Date Dose VIS Date Route   Pfizer COVID-19 Vaccine 12/06/2019  3:27 PM 0.3 mL 09/26/2018 Intramuscular   Manufacturer: [employer], [employer]   Lot: [id number]   NDC: [address]-1

## 2020-01-01 ENCOUNTER — Other Ambulatory Visit: Payer: Self-pay | Admitting: Family Medicine

## 2020-01-01 ENCOUNTER — Ambulatory Visit: Payer: Self-pay | Attending: Internal Medicine

## 2020-01-01 DIAGNOSIS — Z23 Encounter for immunization: Secondary | ICD-10-CM

## 2020-01-01 DIAGNOSIS — F419 Anxiety disorder, unspecified: Secondary | ICD-10-CM

## 2020-01-01 DIAGNOSIS — F339 Major depressive disorder, recurrent, unspecified: Secondary | ICD-10-CM

## 2020-01-01 NOTE — Progress Notes (Signed)
   Covid-19 Vaccination Clinic  Name:  Dominique Perry    MRN: [id number] DOB: 1986/03/26  01/01/2020  [name] was observed post Covid-19 immunization for 15 minutes without incident. She was provided with Vaccine Information Sheet and instruction to access the V-Safe system.   [name] was instructed to call 911 with any severe reactions post vaccine: Marland Kitchen Difficulty breathing  . Swelling of face and throat  . A fast heartbeat  . A bad rash all over body  . Dizziness and weakness   Immunizations Administered    Name Date Dose VIS Date Route   Pfizer COVID-19 Vaccine 01/01/2020  2:58 PM 0.3 mL 09/26/2018 Intramuscular   Manufacturer: [employer], [employer]   Lot: [id number]   NDC: [address]-6

## 2020-04-14 ENCOUNTER — Ambulatory Visit
Admission: EM | Admit: 2020-04-14 | Discharge: 2020-04-14 | Disposition: A | Discharge status: Home / Self Care | Payer: No Typology Code available for payment source | Attending: Physician Assistant | Admitting: Physician Assistant

## 2020-04-14 ENCOUNTER — Other Ambulatory Visit: Payer: Self-pay

## 2020-04-14 ENCOUNTER — Ambulatory Visit (HOSPITAL_COMMUNITY)
Admission: EM | Admit: 2020-04-14 | Discharge: 2020-04-14 | Disposition: A | Discharge status: Home / Self Care | Payer: Self-pay

## 2020-04-14 DIAGNOSIS — Z1152 Encounter for screening for COVID-19: Secondary | ICD-10-CM | POA: Diagnosis not present

## 2020-04-14 DIAGNOSIS — Z20822 Contact with and (suspected) exposure to covid-19: Secondary | ICD-10-CM

## 2020-04-14 MED ORDER — FLUTICASONE PROPIONATE 50 MCG/ACT NA SUSP: 2.0000 | Freq: Every day | NASAL | 0 refills | Status: AC

## 2020-04-14 MED ORDER — BENZONATATE 200 MG PO CAPS: 200.0000 mg | ORAL_CAPSULE | Freq: Three times a day (TID) | ORAL | 0 refills | Status: AC

## 2020-04-14 NOTE — Discharge Instructions (Addendum)
COVID PCR testing ordered. As discussed, given your exposure and symptoms, I would like you to quarantine as if you are positive for COVID regardless of symptoms.  You can discontinue quarantine after 10 days of symptom onset AND 24 hours of no fever with improvement of symptoms.   Tessalon for cough. Flonase for nasal congestion/sinus pressure. Tylenol/motrin for pain and fever. Keep hydrated, urine should be clear to pale yellow in color. If experiencing shortness of breath, trouble breathing, go to the emergency department for further evaluation needed.

## 2020-04-14 NOTE — ED Provider Notes (Signed)
[facility]    CSN: [id number] Arrival date & time: 04/14/20  1841      History   Chief Complaint Chief Complaint  Patient presents with  . Cough    HPI Dominique Perry is a 34 y.o. female.   34 year old female comes in for 3 day of URI symptoms. Nasal congestion, cough, body aches, headache. Denies fever. Denies abdominal pain, nausea, vomiting, diarrhea. Denies shortness of breath, loss of taste/smell. Living with positive COVID family member.      Past Medical History:  Diagnosis Date  . Anemia   . Pyelonephritis   . Ureteral stone with hydronephrosis     Patient Active Problem List   Diagnosis Date Noted  . Hypokalemia 09/13/2012  . Normocytic anemia 09/13/2012  . Pyelonephritis 09/12/2012  . Hydronephrosis 09/12/2012  . Tobacco abuse 09/12/2012    Past Surgical History:  Procedure Laterality Date  . CYSTOSCOPY WITH STENT PLACEMENT Right 09/12/2012   Procedure: CYSTOSCOPY WITH STENT PLACEMENT;  Surgeon: Valetta Fuller, MD;  Location: [facility];  Service: Urology;  Laterality: Right;  . CYSTOSCOPY/RETROGRADE/URETEROSCOPY/STONE EXTRACTION WITH BASKET Right 09/26/2012   Procedure: RIGHT URETEROSCOPY/STONE EXTRACTION WITH STENT;  Surgeon: Anner Crete, MD;  Location: [facility];  Service: Urology;  Laterality: Right;  . HOLMIUM LASER APPLICATION Right 09/26/2012   Procedure: HOLMIUM LASER APPLICATION;  Surgeon: Anner Crete, MD;  Location: [facility];  Service: Urology;  Laterality: Right;  . OPEN REDUCTION INTERNAL FIXATION (ORIF) DISTAL RADIAL FRACTURE Right 07/23/2015   Procedure: OPEN REDUCTION INTERNAL FIXATION (ORIF) DISTAL RADIAL FRACTURE;  Surgeon: Bradly Bienenstock, MD;  Location: [facility];  Service: Orthopedics;  Laterality: Right;  . TUBAL LIGATION    . WRIST SURGERY      OB History   No obstetric history on file.      Home Medications    Prior to Admission medications   Medication Sig Start Date End Date Taking? Authorizing Provider  ADDERALL XR 15 MG  24 hr capsule TK 1 C PO ONCE D IN THE MORNING 10/04/18   [provider]  ADDERALL XR 20 MG 24 hr capsule TK 1 C PO ONCE D IN THE AFTERNOON 10/04/18   [provider]  benzonatate (TESSALON) 200 MG capsule Take 1 capsule (200 mg total) by mouth every 8 (eight) hours. 04/14/20   Cathie Hoops, Tevis Dunavan V, PA-C  escitalopram (LEXAPRO) 10 MG tablet Take 1qd (Must sched F/U for future fills) 12/04/19   Cirigliano, Mary K, DO  fluticasone (FLONASE) 50 MCG/ACT nasal spray Place 2 sprays into both nostrils daily. 04/14/20   Belinda Fisher, PA-C    Family History Family History  Problem Relation Age of Onset  . Hypertension Father   . Lung cancer Father     Social History Social History   Tobacco Use  . Smoking status: Current Every Day Smoker    Packs/day: 0.25    Years: 11.00    Pack years: 2.75    Types: Cigarettes  . Smokeless tobacco: Never Used  Substance Use Topics  . Alcohol use: Yes    Comment: OCCAS  . Drug use: Not Currently    Types: Marijuana     Allergies   Vicodin [hydrocodone-acetaminophen] and Hydrocodone-acetaminophen   Review of Systems Review of Systems  Reason unable to perform ROS: See HPI as above.     Physical Exam Triage Vital Signs ED Triage Vitals  Enc Vitals Group     BP 04/14/20 1856 (!) 164/94  Pulse Rate 04/14/20 1856 82     Resp 04/14/20 1856 18     Temp 04/14/20 1856 99.1 F (37.3 C)     Temp Source 04/14/20 1856 Oral     SpO2 04/14/20 1856 98 %     Weight --      Height --      Head Circumference --      Peak Flow --      Pain Score 04/14/20 1857 0     Pain Loc --      Pain Edu? --      Excl. in GC? --    No data found.  Updated Vital Signs BP (!) 164/94 (BP Location: Left Arm)   Pulse 82   Temp 99.1 F (37.3 C) (Oral)   Resp 18   LMP 03/25/2020   SpO2 98%   Physical Exam Constitutional:      General: She is not in acute distress.    Appearance: Normal appearance. She is not ill-appearing, toxic-appearing or diaphoretic.    HENT:     Head: Normocephalic and atraumatic.     Nose:     Right Sinus: Maxillary sinus tenderness and frontal sinus tenderness present.     Left Sinus: Maxillary sinus tenderness and frontal sinus tenderness present.     Mouth/Throat:     Mouth: Mucous membranes are moist.     Pharynx: Oropharynx is clear. Uvula midline.  Cardiovascular:     Rate and Rhythm: Normal rate and regular rhythm.     Heart sounds: Normal heart sounds. No murmur heard.  No friction rub. No gallop.   Pulmonary:     Effort: Pulmonary effort is normal. No accessory muscle usage, prolonged expiration, respiratory distress or retractions.     Comments: Lungs clear to auscultation without adventitious lung sounds. Musculoskeletal:     Cervical back: Normal range of motion and neck supple.  Neurological:     General: No focal deficit present.     Mental Status: She is alert and oriented to person, place, and time.      UC Treatments / Results  Labs (all labs ordered are listed, but only abnormal results are displayed) Labs Reviewed  NOVEL CORONAVIRUS, NAA    EKG   Radiology No results found.  Procedures Procedures (including critical care time)  Medications Ordered in UC Medications - No data to display  Initial Impression / Assessment and Plan / UC Course  I have reviewed the triage vital signs and the nursing notes.  Pertinent labs & imaging results that were available during my care of the patient were reviewed by me and considered in my medical decision making (see chart for details).    COVID PCR test ordered. However, given patient's exposure/symptoms, suspect COVID and will have patient follow quarantine instructions for COVID. No alarming signs on exam.  LCTAB. Symptomatic treatment discussed.  Push fluids.  Return precautions given.  Patient expresses understanding and agrees to plan.  Final Clinical Impressions(s) / UC Diagnoses   Final diagnoses:  Encounter for screening for  COVID-19  Suspected COVID-19 virus infection   ED Prescriptions    Medication Sig Dispense Auth. Provider   benzonatate (TESSALON) 200 MG capsule Take 1 capsule (200 mg total) by mouth every 8 (eight) hours. 21 capsule Sabrinna Yearwood V, PA-C   fluticasone (FLONASE) 50 MCG/ACT nasal spray Place 2 sprays into both nostrils daily. 1 g Belinda Fisher, PA-C     PDMP not reviewed this encounter.  Belinda Fisher, PA-C 04/14/20 1932

## 2020-04-14 NOTE — ED Triage Notes (Signed)
Pt c/o cough, nasal congestion, headache, and body aches since Saturday. States her mom is positive covid

## 2020-04-17 LAB — NOVEL CORONAVIRUS, NAA: SARS-CoV-2, NAA: NOT DETECTED

## 2021-05-11 ENCOUNTER — Emergency Department (HOSPITAL_BASED_OUTPATIENT_CLINIC_OR_DEPARTMENT_OTHER): Payer: Self-pay | Admitting: Radiology

## 2021-05-11 ENCOUNTER — Encounter (HOSPITAL_BASED_OUTPATIENT_CLINIC_OR_DEPARTMENT_OTHER): Payer: Self-pay | Admitting: Obstetrics and Gynecology

## 2021-05-11 ENCOUNTER — Emergency Department (HOSPITAL_BASED_OUTPATIENT_CLINIC_OR_DEPARTMENT_OTHER)
Admission: EM | Admit: 2021-05-11 | Discharge: 2021-05-11 | Disposition: A | Discharge status: Home / Self Care | Payer: Self-pay | Attending: Emergency Medicine | Admitting: Emergency Medicine

## 2021-05-11 ENCOUNTER — Other Ambulatory Visit: Payer: Self-pay

## 2021-05-11 DIAGNOSIS — N9489 Other specified conditions associated with female genital organs and menstrual cycle: Secondary | ICD-10-CM | POA: Insufficient documentation

## 2021-05-11 DIAGNOSIS — F1010 Alcohol abuse, uncomplicated: Secondary | ICD-10-CM

## 2021-05-11 DIAGNOSIS — Y906 Blood alcohol level of 120-199 mg/100 ml: Secondary | ICD-10-CM | POA: Insufficient documentation

## 2021-05-11 DIAGNOSIS — R072 Precordial pain: Secondary | ICD-10-CM | POA: Insufficient documentation

## 2021-05-11 DIAGNOSIS — I4510 Unspecified right bundle-branch block: Secondary | ICD-10-CM | POA: Insufficient documentation

## 2021-05-11 DIAGNOSIS — F17210 Nicotine dependence, cigarettes, uncomplicated: Secondary | ICD-10-CM | POA: Insufficient documentation

## 2021-05-11 DIAGNOSIS — R000 Tachycardia, unspecified: Secondary | ICD-10-CM | POA: Insufficient documentation

## 2021-05-11 DIAGNOSIS — F10180 Alcohol abuse with alcohol-induced anxiety disorder: Secondary | ICD-10-CM | POA: Insufficient documentation

## 2021-05-11 LAB — HEPATIC FUNCTION PANEL
ALT: 26 U/L (ref 0–44)
AST: 22 U/L (ref 15–41)
Albumin: 4.4 g/dL (ref 3.5–5.0)
Alkaline Phosphatase: 51 U/L (ref 38–126)
Bilirubin, Direct: 0.1 mg/dL (ref 0.0–0.2)
Indirect Bilirubin: 0.2 mg/dL — ABNORMAL LOW (ref 0.3–0.9)
Total Bilirubin: 0.3 mg/dL (ref 0.3–1.2)
Total Protein: 7.5 g/dL (ref 6.5–8.1)

## 2021-05-11 LAB — CBC
HCT: 40.7 % (ref 36.0–46.0)
Hemoglobin: 14.3 g/dL (ref 12.0–15.0)
MCH: 31.2 pg (ref 26.0–34.0)
MCHC: 35.1 g/dL (ref 30.0–36.0)
MCV: 88.9 fL (ref 80.0–100.0)
Platelets: 311 10*3/uL (ref 150–400)
RBC: 4.58 MIL/uL (ref 3.87–5.11)
RDW: 12.5 % (ref 11.5–15.5)
WBC: 8.4 10*3/uL (ref 4.0–10.5)
nRBC: 0 % (ref 0.0–0.2)

## 2021-05-11 LAB — BASIC METABOLIC PANEL
Anion gap: 14 (ref 5–15)
BUN: 7 mg/dL (ref 6–20)
CO2: 18 mmol/L — ABNORMAL LOW (ref 22–32)
Calcium: 9.6 mg/dL (ref 8.9–10.3)
Chloride: 107 mmol/L (ref 98–111)
Creatinine, Ser: 0.65 mg/dL (ref 0.44–1.00)
GFR, Estimated: >60 mL/min (ref >60)
Glucose, Bld: 99 mg/dL (ref 70–99)
Potassium: 3.5 mmol/L (ref 3.5–5.1)
Sodium: 139 mmol/L (ref 135–145)

## 2021-05-11 LAB — ACETAMINOPHEN LEVEL: Acetaminophen (Tylenol), Serum: <10 ug/mL — ABNORMAL LOW (ref 10–30)

## 2021-05-11 LAB — D-DIMER, QUANTITATIVE: D-Dimer, Quant: 0.32 ug/mL-FEU (ref 0.00–0.50)

## 2021-05-11 LAB — HCG, QUANTITATIVE, PREGNANCY: hCG, Beta Chain, Quant, S: <1 m[IU]/mL (ref <5)

## 2021-05-11 LAB — ETHANOL: Alcohol, Ethyl (B): 168 mg/dL — ABNORMAL HIGH (ref <10)

## 2021-05-11 LAB — LIPASE, BLOOD: Lipase: 13 U/L (ref 11–51)

## 2021-05-11 LAB — TROPONIN I (HIGH SENSITIVITY): Troponin I (High Sensitivity): <2 ng/L (ref <18)

## 2021-05-11 MED ORDER — SODIUM CHLORIDE 0.9 % IV BOLUS
1000.0000 mL | Freq: Once | INTRAVENOUS | Status: AC
  Administered 2021-05-11: 1000 mL via INTRAVENOUS

## 2021-05-11 MED ORDER — CHLORDIAZEPOXIDE HCL 25 MG PO CAPS
ORAL_CAPSULE | ORAL | 0 refills | Status: AC
Start: 2021-05-11 — End: ?

## 2021-05-11 MED ORDER — LORAZEPAM 2 MG/ML IJ SOLN
1.0000 mg | Freq: Once | INTRAMUSCULAR | Status: DC
  Filled 2021-05-11: qty 1

## 2021-05-11 MED ORDER — LORAZEPAM 2 MG/ML IJ SOLN
1.0000 mg | Freq: Once | INTRAMUSCULAR | Status: AC
  Administered 2021-05-11: 1 mg via INTRAVENOUS

## 2021-05-11 NOTE — ED Provider Notes (Signed)
[facility] EMERGENCY DEPT Provider Note   CSN: [id number] Arrival date & time: 05/11/21  1658     History Chief Complaint  Patient presents with   Chest Pain    Dominique Perry is a 35 y.o. female.   Chest Pain Pain location:  Substernal area Pain quality: not aching   Pain radiates to:  Does not radiate Pain severity:  No pain Onset quality:  Gradual Timing:  Intermittent Progression:  Waxing and waning Chronicity:  New Context comment:  ETOH use today and anxiety feelings, here with friends, wants to get sober Relieved by:  Nothing Worsened by:  Nothing Associated symptoms: no abdominal pain, no back pain, no cough, no fever, no palpitations, no shortness of breath and no vomiting       Past Medical History:  Diagnosis Date   Anemia    Pyelonephritis    Ureteral stone with hydronephrosis     Patient Active Problem List   Diagnosis Date Noted   Hypokalemia 09/13/2012   Normocytic anemia 09/13/2012   Pyelonephritis 09/12/2012   Hydronephrosis 09/12/2012   Tobacco abuse 09/12/2012    Past Surgical History:  Procedure Laterality Date   CYSTOSCOPY WITH STENT PLACEMENT Right 09/12/2012   Procedure: CYSTOSCOPY WITH STENT PLACEMENT;  Surgeon: Valetta Fuller, MD;  Location: [facility];  Service: Urology;  Laterality: Right;   CYSTOSCOPY/RETROGRADE/URETEROSCOPY/STONE EXTRACTION WITH BASKET Right 09/26/2012   Procedure: RIGHT URETEROSCOPY/STONE EXTRACTION WITH STENT;  Surgeon: Anner Crete, MD;  Location: [facility];  Service: Urology;  Laterality: Right;   HOLMIUM LASER APPLICATION Right 09/26/2012   Procedure: HOLMIUM LASER APPLICATION;  Surgeon: Anner Crete, MD;  Location: [facility];  Service: Urology;  Laterality: Right;   OPEN REDUCTION INTERNAL FIXATION (ORIF) DISTAL RADIAL FRACTURE Right 07/23/2015   Procedure: OPEN REDUCTION INTERNAL FIXATION (ORIF) DISTAL RADIAL FRACTURE;  Surgeon: Bradly Bienenstock, MD;  Location: [facility];  Service: Orthopedics;  Laterality:  Right;   TUBAL LIGATION     WRIST SURGERY       OB History     Gravida      Para      Term      Preterm      AB      Living  2      SAB      IAB      Ectopic      Multiple      Live Births  2           Family History  Problem Relation Age of Onset   Hypertension Father    Lung cancer Father     Social History   Tobacco Use   Smoking status: Every Day    Packs/day: 0.25    Years: 11.00    Pack years: 2.75    Types: Cigarettes    Passive exposure: Current   Smokeless tobacco: Never  Vaping Use   Vaping Use: Never used  Substance Use Topics   Alcohol use: Yes    Comment: daily, unknown amount   Drug use: Not Currently    Types: Marijuana    Home Medications Prior to Admission medications   Medication Sig Start Date End Date Taking? Authorizing Provider  chlordiazePOXIDE (LIBRIUM) 25 MG capsule 50mg  PO TID x 1D, then 25 mg PO BID X 1D, then 25- mg PO QD X 1D 05/11/21  Yes Shamar Kracke, DO  ADDERALL XR 15 MG 24 hr capsule TK 1 C PO ONCE D IN THE MORNING  10/04/18   [provider]  ADDERALL XR 20 MG 24 hr capsule TK 1 C PO ONCE D IN THE AFTERNOON 10/04/18   [provider]  benzonatate (TESSALON) 200 MG capsule Take 1 capsule (200 mg total) by mouth every 8 (eight) hours. 04/14/20   Cathie Hoops, Amy V, PA-C  escitalopram (LEXAPRO) 10 MG tablet Take 1qd (Must sched F/U for future fills) 12/04/19   Cirigliano, Mary K, DO  fluticasone (FLONASE) 50 MCG/ACT nasal spray Place 2 sprays into both nostrils daily. 04/14/20   Belinda Fisher, PA-C    Allergies    Vicodin [hydrocodone-acetaminophen] and Hydrocodone-acetaminophen  Review of Systems   Review of Systems  Constitutional:  Negative for chills and fever.  HENT:  Negative for ear pain and sore throat.   Eyes:  Negative for pain and visual disturbance.  Respiratory:  Negative for cough and shortness of breath.   Cardiovascular:  Positive for chest pain. Negative for palpitations.   Gastrointestinal:  Negative for abdominal pain and vomiting.  Genitourinary:  Negative for dysuria and hematuria.  Musculoskeletal:  Negative for arthralgias and back pain.  Skin:  Negative for color change and rash.  Neurological:  Negative for seizures and syncope.  All other systems reviewed and are negative.  Physical Exam Updated Vital Signs BP 106/81   Pulse 87   Temp 99.1 F (37.3 C)   Resp 12   SpO2 98%   Physical Exam Vitals and nursing note reviewed.  Constitutional:      General: She is not in acute distress.    Appearance: She is well-developed.  HENT:     Head: Normocephalic and atraumatic.  Eyes:     Extraocular Movements: Extraocular movements intact.     Conjunctiva/sclera: Conjunctivae normal.     Pupils: Pupils are equal, round, and reactive to light.  Cardiovascular:     Rate and Rhythm: Normal rate and regular rhythm.     Heart sounds: Normal heart sounds. No murmur heard. Pulmonary:     Effort: Pulmonary effort is normal. No respiratory distress.     Breath sounds: Normal breath sounds.  Abdominal:     Palpations: Abdomen is soft.     Tenderness: There is no abdominal tenderness.  Musculoskeletal:     Cervical back: Neck supple.     Right lower leg: No edema.     Left lower leg: No edema.  Skin:    General: Skin is warm and dry.  Neurological:     Mental Status: She is alert.  Psychiatric:        Mood and Affect: Mood is anxious.     Comments: Denies SI/HI     ED Results / Procedures / Treatments   Labs (all labs ordered are listed, but only abnormal results are displayed) Labs Reviewed  BASIC METABOLIC PANEL - Abnormal; Notable for the following components:      Result Value   CO2 18 (*)    All other components within normal limits  HEPATIC FUNCTION PANEL - Abnormal; Notable for the following components:   Indirect Bilirubin 0.2 (*)    All other components within normal limits  ACETAMINOPHEN LEVEL - Abnormal; Notable for the  following components:   Acetaminophen (Tylenol), Serum <10 (*)    All other components within normal limits  ETHANOL - Abnormal; Notable for the following components:   Alcohol, Ethyl (B) 168 (*)    All other components within normal limits  CBC  LIPASE, BLOOD  HCG, QUANTITATIVE, PREGNANCY  D-DIMER, QUANTITATIVE  TROPONIN I (HIGH SENSITIVITY)    EKG EKG Interpretation  Date/Time:  Monday May 11 2021 17:08:00 EDT Ventricular Rate:  116 PR Interval:  144 QRS Duration: 94 QT Interval:  324 QTC Calculation: 450 R Axis:   89 Text Interpretation: Sinus tachycardia Right atrial enlargement Incomplete right bundle branch block Borderline ECG Confirmed by Virgina Norfolk ([id number]) on 05/11/2021 5:09:02 PM  Radiology DG Chest 2 View  Result Date: 05/11/2021 CLINICAL DATA:  Chest pain and anxiety EXAM: CHEST - 2 VIEW COMPARISON:  May 30, 2019 FINDINGS: The heart size and mediastinal contours are within normal limits. Both lungs are clear. The visualized skeletal structures are unremarkable. IMPRESSION: No active cardiopulmonary disease. Electronically Signed   By: Aram Candela M.D.   On: 05/11/2021 17:54    Procedures Procedures   Medications Ordered in ED Medications  LORazepam (ATIVAN) injection 1 mg (has no administration in time range)  sodium chloride 0.9 % bolus 1,000 mL (1,000 mLs Intravenous New Bag/Given 05/11/21 2004)  LORazepam (ATIVAN) injection 1 mg (1 mg Intravenous Given 05/11/21 1947)    ED Course  I have reviewed the triage vital signs and the nursing notes.  Pertinent labs & imaging results that were available during my care of the patient were reviewed by me and considered in my medical decision making (see chart for details).    MDM Rules/Calculators/A&P                           Edison Nasuti is here with chest pain, anxiety.  Patient tachycardic but otherwise unremarkable vitals.  EKG shows sinus tachycardia.  No ischemic changes.  Overall she  appears anxious.  She admits alcohol use today.  She is here with a friend who is trying to help get her sober.  She is try to get to rehab tomorrow.  She denies any suicidal homicidal ideation.  Extensive work-up was done.  Troponin normal.  Doubt ACS.  D-dimer normal doubt PE.  Chest x-ray negative for infection.  No significant anemia, electrolyte abnormality, kidney injury otherwise.  Alcohol level was elevated.  She was given some Ativan to help with her anxiety as well.  Overall was able to talk with the patient about try to get sober.  Sounds like she has good help right now to get her into rehab tomorrow.  Will prescribe her Librium and recommend that if she developed any suicidal homicidal ideation that she return for evaluation.  Talk to her about how she takes the Librium and drinks at the same time she can really harm her self.  Overall feel comfortable prescribing her Librium.  Discharged in ED good condition.  She has her resources for outpatient rehab.  She also has a friend with her at this time too.  This chart was dictated using voice recognition software.  Despite best efforts to proofread,  errors can occur which can change the documentation meaning.   Final Clinical Impression(s) / ED Diagnoses Final diagnoses:  ETOH abuse    Rx / DC Orders ED Discharge Orders          Ordered    chlordiazePOXIDE (LIBRIUM) 25 MG capsule        05/11/21 2022             Virgina Norfolk, DO 05/11/21 2025

## 2021-05-11 NOTE — ED Triage Notes (Signed)
Patient reports to the ER for increased alcohol consumption, chest pain, and anxiety. Patient reports her last drink was today within the last few hours.

## 2021-05-11 NOTE — Discharge Instructions (Signed)
Please do not mix Librium and alcohol.  If you are not ready to get sober do not take the Librium.  Please go to rehab as we discussed.  Please return to the ED if you have worsening symptoms as discussed as well including suicidal ideation.

## 2021-12-18 ENCOUNTER — Other Ambulatory Visit (HOSPITAL_COMMUNITY): Payer: Self-pay

## 2021-12-18 MED ORDER — AMPHETAMINE-DEXTROAMPHET ER 20 MG PO CP24
ORAL_CAPSULE | ORAL | 0 refills | Status: AC
  Filled 2021-12-18: qty 60, 30d supply, fill #0

## 2021-12-18 MED ORDER — GUANFACINE HCL 1 MG PO TABS
ORAL_TABLET | ORAL | 3 refills | Status: AC
  Filled 2021-12-18: qty 60, 30d supply, fill #0

## 2021-12-18 MED ORDER — AMPHETAMINE-DEXTROAMPHET ER 20 MG PO CP24
ORAL_CAPSULE | ORAL | 0 refills | Status: AC
  Filled 2022-01-19: qty 60, 30d supply, fill #0

## 2021-12-18 MED ORDER — AMPHETAMINE-DEXTROAMPHET ER 20 MG PO CP24
ORAL_CAPSULE | ORAL | 0 refills | Status: DC
  Filled 2022-02-16: qty 60, 30d supply, fill #0

## 2021-12-31 ENCOUNTER — Other Ambulatory Visit (HOSPITAL_COMMUNITY): Payer: Self-pay

## 2022-01-19 ENCOUNTER — Other Ambulatory Visit (HOSPITAL_COMMUNITY): Payer: Self-pay

## 2022-02-16 ENCOUNTER — Other Ambulatory Visit (HOSPITAL_COMMUNITY): Payer: Self-pay

## 2022-03-17 ENCOUNTER — Other Ambulatory Visit (HOSPITAL_COMMUNITY): Payer: Self-pay

## 2022-03-17 MED ORDER — GUANFACINE HCL 1 MG PO TABS
ORAL_TABLET | ORAL | 2 refills | Status: AC
  Filled 2022-03-17: qty 60, 30d supply, fill #0

## 2022-03-17 MED ORDER — AMPHETAMINE-DEXTROAMPHET ER 20 MG PO CP24
ORAL_CAPSULE | ORAL | 0 refills | Status: AC
  Filled 2022-03-17: qty 60, 30d supply, fill #0

## 2022-04-15 ENCOUNTER — Other Ambulatory Visit (HOSPITAL_COMMUNITY): Payer: Self-pay

## 2022-04-19 ENCOUNTER — Other Ambulatory Visit (HOSPITAL_COMMUNITY): Payer: Self-pay

## 2022-04-20 ENCOUNTER — Other Ambulatory Visit (HOSPITAL_COMMUNITY): Payer: Self-pay

## 2022-04-20 MED ORDER — AMPHETAMINE-DEXTROAMPHET ER 20 MG PO CP24
ORAL_CAPSULE | ORAL | 0 refills | Status: DC
  Filled 2022-04-20: qty 60, 30d supply, fill #0

## 2022-04-21 ENCOUNTER — Other Ambulatory Visit (HOSPITAL_COMMUNITY): Payer: Self-pay

## 2022-04-26 ENCOUNTER — Other Ambulatory Visit (HOSPITAL_COMMUNITY): Payer: Self-pay

## 2022-05-20 ENCOUNTER — Other Ambulatory Visit (HOSPITAL_COMMUNITY): Payer: Self-pay

## 2022-05-20 MED ORDER — AMPHETAMINE-DEXTROAMPHET ER 20 MG PO CP24
ORAL_CAPSULE | ORAL | 0 refills | Status: DC
  Filled 2022-05-20: qty 60, 30d supply, fill #0

## 2022-06-23 ENCOUNTER — Other Ambulatory Visit (HOSPITAL_COMMUNITY): Payer: Self-pay

## 2022-06-23 MED ORDER — AMPHETAMINE-DEXTROAMPHET ER 20 MG PO CP24
ORAL_CAPSULE | ORAL | 0 refills | Status: DC
  Filled 2022-06-23: qty 60, 30d supply, fill #0

## 2022-06-28 ENCOUNTER — Other Ambulatory Visit (HOSPITAL_COMMUNITY): Payer: Self-pay

## 2022-07-27 ENCOUNTER — Other Ambulatory Visit (HOSPITAL_COMMUNITY): Payer: Self-pay

## 2022-07-29 ENCOUNTER — Other Ambulatory Visit (HOSPITAL_COMMUNITY): Payer: Self-pay

## 2022-07-29 MED ORDER — AMPHETAMINE-DEXTROAMPHET ER 20 MG PO CP24
20.0000 mg | ORAL_CAPSULE | Freq: Two times a day (BID) | ORAL | 0 refills | Status: AC
  Filled 2022-07-29: qty 30, 15d supply, fill #0

## 2022-07-30 ENCOUNTER — Other Ambulatory Visit (HOSPITAL_COMMUNITY): Payer: Self-pay

## 2022-08-09 ENCOUNTER — Other Ambulatory Visit (HOSPITAL_COMMUNITY): Payer: Self-pay

## 2022-08-09 MED ORDER — AMPHETAMINE-DEXTROAMPHET ER 20 MG PO CP24
20.0000 mg | ORAL_CAPSULE | Freq: Two times a day (BID) | ORAL | 0 refills | Status: AC
  Filled 2022-09-09 (×2): qty 60, 30d supply, fill #0

## 2022-08-09 MED ORDER — AMPHETAMINE-DEXTROAMPHET ER 20 MG PO CP24
20.0000 mg | ORAL_CAPSULE | Freq: Two times a day (BID) | ORAL | 0 refills | Status: AC
  Filled 2022-08-09: qty 60, 30d supply, fill #0

## 2022-08-09 MED ORDER — GUANFACINE HCL 1 MG PO TABS
1.0000 mg | ORAL_TABLET | Freq: Every evening | ORAL | 2 refills | Status: AC
  Filled 2022-08-09: qty 60, 30d supply, fill #0

## 2022-08-09 MED ORDER — AMPHETAMINE-DEXTROAMPHET ER 20 MG PO CP24
20.0000 mg | ORAL_CAPSULE | Freq: Two times a day (BID) | ORAL | 0 refills | Status: DC
  Filled 2022-10-07 (×2): qty 60, 30d supply, fill #0

## 2022-09-07 ENCOUNTER — Other Ambulatory Visit (HOSPITAL_COMMUNITY): Payer: Self-pay

## 2022-09-09 ENCOUNTER — Other Ambulatory Visit: Payer: Self-pay

## 2022-09-09 ENCOUNTER — Other Ambulatory Visit (HOSPITAL_COMMUNITY): Payer: Self-pay

## 2022-10-06 ENCOUNTER — Other Ambulatory Visit (HOSPITAL_COMMUNITY): Payer: Self-pay

## 2022-10-07 ENCOUNTER — Other Ambulatory Visit (HOSPITAL_COMMUNITY): Payer: Self-pay

## 2022-10-07 ENCOUNTER — Other Ambulatory Visit: Payer: Self-pay

## 2022-10-08 ENCOUNTER — Other Ambulatory Visit: Payer: Self-pay

## 2022-10-18 ENCOUNTER — Encounter (HOSPITAL_BASED_OUTPATIENT_CLINIC_OR_DEPARTMENT_OTHER): Payer: Self-pay | Admitting: Emergency Medicine

## 2022-10-18 ENCOUNTER — Other Ambulatory Visit: Payer: Self-pay

## 2022-10-18 ENCOUNTER — Emergency Department (HOSPITAL_BASED_OUTPATIENT_CLINIC_OR_DEPARTMENT_OTHER)
Admission: EM | Admit: 2022-10-18 | Discharge: 2022-10-18 | Disposition: A | Discharge status: Home / Self Care | Payer: Self-pay | Attending: Emergency Medicine | Admitting: Emergency Medicine

## 2022-10-18 DIAGNOSIS — S61412A Laceration without foreign body of left hand, initial encounter: Secondary | ICD-10-CM

## 2022-10-18 DIAGNOSIS — Y990 Civilian activity done for income or pay: Secondary | ICD-10-CM | POA: Insufficient documentation

## 2022-10-18 DIAGNOSIS — Z23 Encounter for immunization: Secondary | ICD-10-CM | POA: Insufficient documentation

## 2022-10-18 DIAGNOSIS — W25XXXA Contact with sharp glass, initial encounter: Secondary | ICD-10-CM | POA: Insufficient documentation

## 2022-10-18 DIAGNOSIS — S61211A Laceration without foreign body of left index finger without damage to nail, initial encounter: Secondary | ICD-10-CM | POA: Insufficient documentation

## 2022-10-18 MED ORDER — TETANUS-DIPHTH-ACELL PERTUSSIS 5-2.5-18.5 LF-MCG/0.5 IM SUSY
0.5000 mL | PREFILLED_SYRINGE | Freq: Once | INTRAMUSCULAR | Status: AC
  Administered 2022-10-18: 0.5 mL via INTRAMUSCULAR
  Filled 2022-10-18: qty 0.5

## 2022-10-18 MED ORDER — ACETAMINOPHEN 500 MG PO TABS
1000.0000 mg | ORAL_TABLET | Freq: Once | ORAL | Status: AC
  Administered 2022-10-18: 1000 mg via ORAL
  Filled 2022-10-18: qty 2

## 2022-10-18 NOTE — ED Notes (Signed)
Reviewed AVS/discharge instruction with patient. Time allotted for and all questions answered. Patient is agreeable for d/c and escorted to ed exit by staff.  

## 2022-10-18 NOTE — ED Provider Notes (Signed)
[facility] Provider Note   CSN: [id number] Arrival date & time: 10/18/22  0139     History  Chief Complaint  Patient presents with   Laceration    Dominique Perry is a 37 y.o. female.  HPI     This is a 37 year old female who presents with an injury to the left hand.  Patient reports that she was pulling a bottle out of a cabinet at work when it dropped cutting her left hand.  She is right-handed.  This happened several hours ago.  She has noted some ongoing bleeding.  She reports pain.  No difficulty with flexion or extension of the digits.  Unknown last tetanus.  Home Medications Prior to Admission medications   Medication Sig Start Date End Date Taking? Authorizing Provider  ADDERALL XR 15 MG 24 hr capsule TK 1 C PO ONCE D IN THE MORNING 10/04/18   [provider]  ADDERALL XR 20 MG 24 hr capsule TK 1 C PO ONCE D IN THE AFTERNOON 10/04/18   [provider]  amphetamine-dextroamphetamine (ADDERALL XR) 20 MG 24 hr capsule Take 1 capsule by mouth in the morning and 1 capsule in the afternoon. 01/17/22     amphetamine-dextroamphetamine (ADDERALL XR) 20 MG 24 hr capsule Take 1 capsule by mouth in the morning and 1 capsule in the afternoon. 12/18/21     amphetamine-dextroamphetamine (ADDERALL XR) 20 MG 24 hr capsule Take 1 capsule by mouth in the morning and 1 capsule by mouth every afternoon. 03/17/22     amphetamine-dextroamphetamine (ADDERALL XR) 20 MG 24 hr capsule Take 1 capsule (20 mg total) by mouth 2 (two) times daily in the morning and afternoon 07/28/22     amphetamine-dextroamphetamine (ADDERALL XR) 20 MG 24 hr capsule Take 1 capsule (20 mg total) by mouth 2 (two) times daily in the morning and afternoon 09/07/22     amphetamine-dextroamphetamine (ADDERALL XR) 20 MG 24 hr capsule Take 1 capsule (20 mg total) by mouth 2 (two) times daily  in the morning and afternoon 10/07/22     amphetamine-dextroamphetamine (ADDERALL XR) 20  MG 24 hr capsule Take 1 capsule (20 mg total) by mouth 2 (two) times daily  in the morning and afternoon 08/09/22     benzonatate (TESSALON) 200 MG capsule Take 1 capsule (200 mg total) by mouth every 8 (eight) hours. 04/14/20   Tasia Catchings, Amy V, PA-C  chlordiazePOXIDE (LIBRIUM) 25 MG capsule 50mg  PO TID x 1D, then 25 mg PO BID X 1D, then 25- mg PO QD X 1D 05/11/21   Curatolo, Adam, DO  escitalopram (LEXAPRO) 10 MG tablet Take 1qd (Must sched F/U for future fills) 12/04/19   Cirigliano, Mary K, DO  fluticasone (FLONASE) 50 MCG/ACT nasal spray Place 2 sprays into both nostrils daily. 04/14/20   Tasia Catchings, Amy V, PA-C  guanFACINE (TENEX) 1 MG tablet Take 1 -2 tablets by mouth every night at bedtime. 12/18/21     guanFACINE (TENEX) 1 MG tablet Take 1-2 tablets by mouth at bedtime 03/17/22     guanFACINE (TENEX) 1 MG tablet Take 1-2 tablets (1-2 mg total) by mouth at bedtime. 08/09/22         Allergies    Vicodin [hydrocodone-acetaminophen] and Hydrocodone-acetaminophen    Review of Systems   Review of Systems  Skin:  Positive for wound.  All other systems reviewed and are negative.   Physical Exam Updated Vital Signs BP (!) 153/100   Pulse 94  Temp 98.5 F (36.9 C)   Resp 16   LMP 10/02/2022 (Approximate)   SpO2 95%  Physical Exam Vitals and nursing note reviewed.  Constitutional:      Appearance: She is well-developed.     Comments: Anxious appearing but nontoxic  HENT:     Head: Normocephalic and atraumatic.  Eyes:     Pupils: Pupils are equal, round, and reactive to light.  Cardiovascular:     Rate and Rhythm: Normal rate and regular rhythm.  Pulmonary:     Effort: Pulmonary effort is normal. No respiratory distress.  Abdominal:     Palpations: Abdomen is soft.  Musculoskeletal:     Cervical back: Neck supple.     Comments: 1 x 0.5 cm skin defect/avulsion at the base of the palmar aspect of the left second digit slight oozing noted, no palpable foreign bodies Flexion extension of all 5 digits  intact  Skin:    General: Skin is warm and dry.  Neurological:     Mental Status: She is alert and oriented to person, place, and time.  Psychiatric:     Comments: Anxious     ED Results / Procedures / Treatments   Labs (all labs ordered are listed, but only abnormal results are displayed) Labs Reviewed - No data to display  EKG None  Radiology No results found.  Procedures Procedures    Medications Ordered in ED Medications  Tdap (BOOSTRIX) injection 0.5 mL (0.5 mLs Intramuscular Given 10/18/22 0157)    ED Course/ Medical Decision Making/ A&P                             Medical Decision Making Risk Prescription drug management.   This patient presents to the ED for concern of head injury, this involves an extensive number of treatment options, and is a complaint that carries with it a high risk of complications and morbidity.  I considered the following differential and admission for this acute, potentially life threatening condition.  The differential diagnosis includes laceration, skin avulsion, tendon injury, deep space injury, foreign body  MDM:    This is a 37 year old female who presents with an injury to the left hand.  Reports that glass cut it.  She has a skin defect 1 x 0.5 cm.  Slight oozing.  There is loss of skin.  Opted for wound care as it is not particularly amenable to suturing.  Applied wound clot and a compressive dressing.  Patient was advised regarding ongoing management at home.  Tetanus was updated.  No evidence of foreign body.  Flexion extension of all 5 digits intact and doubt tendon injury.  Do not see any need for x-rays at this time.  (Labs, imaging, consults)  Labs: I Ordered, and personally interpreted labs.  The pertinent results include: None  Imaging Studies ordered: I ordered imaging studies including none I independently visualized and interpreted imaging. I agree with the radiologist interpretation  Additional history  obtained from chart review.  External records from outside source obtained and reviewed including prior evaluations  Cardiac Monitoring: The patient was not maintained on a cardiac monitor.  If on the cardiac monitor, I personally viewed and interpreted the cardiac monitored which showed an underlying rhythm of: N/A  Reevaluation: After the interventions noted above, I reevaluated the patient and found that they have :stayed the same  Social Determinants of Health:  lives independently  Disposition: Discharge  Co morbidities that  complicate the patient evaluation  Past Medical History:  Diagnosis Date   Anemia    Pyelonephritis    Ureteral stone with hydronephrosis      Medicines Meds ordered this encounter  Medications   Tdap (BOOSTRIX) injection 0.5 mL    I have reviewed the patients home medicines and have made adjustments as needed  Problem List / ED Course: Problem List Items Addressed This Visit   None Visit Diagnoses     Laceration of left hand without foreign body, initial encounter    -  Primary                   Final Clinical Impression(s) / ED Diagnoses Final diagnoses:  Laceration of left hand without foreign body, initial encounter    Rx / DC Orders ED Discharge Orders     None         Andra Matsuo, Barbette Hair, MD 10/18/22 [phone]

## 2022-10-18 NOTE — Discharge Instructions (Signed)
You were seen today for an injury to the left hand.  This was not amenable to suturing.  Keep compressive dressing in place for the next 24 hours.  Make sure that you are using antibiotic ointment and a nonadherent dressing to prevent rebleeding.

## 2022-10-18 NOTE — ED Triage Notes (Signed)
Left pointer finger lac/ puncture. Patient attempted to catch broken bottle. Bleeding controlled. Happened a few hours ago. Unknown tetanus

## 2022-11-04 ENCOUNTER — Other Ambulatory Visit (HOSPITAL_COMMUNITY): Payer: Self-pay

## 2022-11-04 MED ORDER — AMPHETAMINE-DEXTROAMPHET ER 20 MG PO CP24
20.0000 mg | ORAL_CAPSULE | Freq: Two times a day (BID) | ORAL | 0 refills | Status: DC
  Filled 2022-11-04: qty 60, 30d supply, fill #0

## 2022-12-06 ENCOUNTER — Other Ambulatory Visit (HOSPITAL_COMMUNITY): Payer: Self-pay

## 2022-12-07 ENCOUNTER — Other Ambulatory Visit (HOSPITAL_COMMUNITY): Payer: Self-pay

## 2022-12-07 MED ORDER — AMPHETAMINE-DEXTROAMPHET ER 20 MG PO CP24
ORAL_CAPSULE | ORAL | 0 refills | Status: DC
  Filled 2022-12-07: qty 60, 30d supply, fill #0

## 2023-01-03 ENCOUNTER — Other Ambulatory Visit (HOSPITAL_COMMUNITY): Payer: Self-pay

## 2023-01-03 MED ORDER — GUANFACINE HCL 1 MG PO TABS
1.0000 mg | ORAL_TABLET | Freq: Every day | ORAL | 1 refills | Status: AC
  Filled 2023-01-03: qty 60, 30d supply, fill #0

## 2023-01-03 MED ORDER — AMPHETAMINE-DEXTROAMPHET ER 20 MG PO CP24
20.0000 mg | ORAL_CAPSULE | Freq: Two times a day (BID) | ORAL | 0 refills | Status: AC
  Filled 2023-02-07: qty 60, 30d supply, fill #0

## 2023-01-03 MED ORDER — AMPHETAMINE-DEXTROAMPHET ER 20 MG PO CP24
20.0000 mg | ORAL_CAPSULE | Freq: Two times a day (BID) | ORAL | 0 refills | Status: AC
  Filled 2023-01-03 – 2023-01-04 (×2): qty 60, 30d supply, fill #0

## 2023-01-03 MED ORDER — AMPHETAMINE-DEXTROAMPHET ER 20 MG PO CP24
20.0000 mg | ORAL_CAPSULE | Freq: Two times a day (BID) | ORAL | 0 refills | Status: AC
  Filled 2023-03-08: qty 60, 30d supply, fill #0

## 2023-01-04 ENCOUNTER — Other Ambulatory Visit (HOSPITAL_COMMUNITY): Payer: Self-pay

## 2023-01-04 ENCOUNTER — Other Ambulatory Visit: Payer: Self-pay

## 2023-01-05 ENCOUNTER — Other Ambulatory Visit (HOSPITAL_COMMUNITY): Payer: Self-pay

## 2023-01-06 ENCOUNTER — Other Ambulatory Visit (HOSPITAL_COMMUNITY): Payer: Self-pay

## 2023-01-07 ENCOUNTER — Other Ambulatory Visit: Payer: Self-pay

## 2023-02-04 ENCOUNTER — Other Ambulatory Visit (HOSPITAL_COMMUNITY): Payer: Self-pay

## 2023-02-07 ENCOUNTER — Other Ambulatory Visit (HOSPITAL_COMMUNITY): Payer: Self-pay

## 2023-02-07 ENCOUNTER — Other Ambulatory Visit: Payer: Self-pay

## 2023-03-08 ENCOUNTER — Other Ambulatory Visit (HOSPITAL_COMMUNITY): Payer: Self-pay

## 2023-03-09 ENCOUNTER — Other Ambulatory Visit (HOSPITAL_COMMUNITY): Payer: Self-pay

## 2023-03-14 ENCOUNTER — Other Ambulatory Visit (HOSPITAL_COMMUNITY): Payer: Self-pay

## 2023-03-15 ENCOUNTER — Other Ambulatory Visit (HOSPITAL_COMMUNITY): Payer: Self-pay

## 2023-03-26 ENCOUNTER — Other Ambulatory Visit (HOSPITAL_COMMUNITY): Payer: Self-pay
